# Patient Record
Sex: Female | Born: 1965 | Race: White | Hispanic: No | Smoking: Never smoker
Health system: Southern US, Community
[De-identification: ages and names within clinical notes are randomized; demographics above are authoritative.]

## PROBLEM LIST (undated history)

## (undated) DIAGNOSIS — R0989 Other specified symptoms and signs involving the circulatory and respiratory systems: Secondary | ICD-10-CM

## (undated) DIAGNOSIS — K660 Peritoneal adhesions (postprocedural) (postinfection): Secondary | ICD-10-CM

## (undated) DIAGNOSIS — R896 Abnormal cytological findings in specimens from other organs, systems and tissues: Secondary | ICD-10-CM

## (undated) DIAGNOSIS — G709 Myoneural disorder, unspecified: Secondary | ICD-10-CM

## (undated) DIAGNOSIS — IMO0002 Reserved for concepts with insufficient information to code with codable children: Secondary | ICD-10-CM

## (undated) DIAGNOSIS — T7840XA Allergy, unspecified, initial encounter: Secondary | ICD-10-CM

## (undated) DIAGNOSIS — F419 Anxiety disorder, unspecified: Secondary | ICD-10-CM

## (undated) DIAGNOSIS — E785 Hyperlipidemia, unspecified: Secondary | ICD-10-CM

## (undated) HISTORY — PX: BREAST BIOPSY: SHX20

## (undated) HISTORY — DX: Abnormal cytological findings in specimens from other organs, systems and tissues: R89.6

## (undated) HISTORY — DX: Reserved for concepts with insufficient information to code with codable children: IMO0002

## (undated) HISTORY — DX: Myoneural disorder, unspecified: G70.9

## (undated) HISTORY — DX: Other specified symptoms and signs involving the circulatory and respiratory systems: R09.89

## (undated) HISTORY — DX: Allergy, unspecified, initial encounter: T78.40XA

## (undated) HISTORY — DX: Peritoneal adhesions (postprocedural) (postinfection): K66.0

## (undated) HISTORY — DX: Anxiety disorder, unspecified: F41.9

## (undated) HISTORY — DX: Hyperlipidemia, unspecified: E78.5

## (undated) HISTORY — PX: LAPAROSCOPIC ENDOMETRIOSIS FULGURATION: SUR769

---
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
## 1993-08-11 HISTORY — PX: NASAL SINUS SURGERY: SHX719

## 1999-08-01 ENCOUNTER — Other Ambulatory Visit: Admission: RE | Admit: 1999-08-01 | Discharge: 1999-08-01 | Payer: Self-pay | Admitting: Gynecology

## 2000-06-22 ENCOUNTER — Other Ambulatory Visit: Admission: RE | Admit: 2000-06-22 | Discharge: 2000-06-22 | Payer: Self-pay | Admitting: Gynecology

## 2000-12-08 ENCOUNTER — Other Ambulatory Visit: Admission: RE | Admit: 2000-12-08 | Discharge: 2000-12-08 | Payer: Self-pay | Admitting: Gynecology

## 2001-07-05 ENCOUNTER — Other Ambulatory Visit: Admission: RE | Admit: 2001-07-05 | Discharge: 2001-07-05 | Payer: Self-pay | Admitting: Gynecology

## 2002-10-11 ENCOUNTER — Other Ambulatory Visit: Admission: RE | Admit: 2002-10-11 | Discharge: 2002-10-11 | Payer: Self-pay | Admitting: Gynecology

## 2003-11-06 ENCOUNTER — Other Ambulatory Visit: Admission: RE | Admit: 2003-11-06 | Discharge: 2003-11-06 | Payer: Self-pay | Admitting: Gynecology

## 2004-11-21 ENCOUNTER — Other Ambulatory Visit: Admission: RE | Admit: 2004-11-21 | Discharge: 2004-11-21 | Payer: Self-pay | Admitting: Gynecology

## 2005-02-12 ENCOUNTER — Other Ambulatory Visit: Admission: RE | Admit: 2005-02-12 | Discharge: 2005-02-12 | Payer: Self-pay | Admitting: Gynecology

## 2005-03-11 DIAGNOSIS — IMO0002 Reserved for concepts with insufficient information to code with codable children: Secondary | ICD-10-CM

## 2005-03-11 HISTORY — DX: Reserved for concepts with insufficient information to code with codable children: IMO0002

## 2005-11-09 DIAGNOSIS — IMO0001 Reserved for inherently not codable concepts without codable children: Secondary | ICD-10-CM

## 2005-11-09 HISTORY — DX: Reserved for inherently not codable concepts without codable children: IMO0001

## 2005-11-24 ENCOUNTER — Other Ambulatory Visit: Admission: RE | Admit: 2005-11-24 | Discharge: 2005-11-24 | Payer: Self-pay | Admitting: Gynecology

## 2006-01-23 ENCOUNTER — Encounter: Admission: RE | Admit: 2006-01-23 | Discharge: 2006-01-23 | Payer: Self-pay | Admitting: Gynecology

## 2006-06-18 ENCOUNTER — Other Ambulatory Visit: Admission: RE | Admit: 2006-06-18 | Discharge: 2006-06-18 | Payer: Self-pay | Admitting: Gynecology

## 2006-11-26 ENCOUNTER — Other Ambulatory Visit: Admission: RE | Admit: 2006-11-26 | Discharge: 2006-11-26 | Payer: Self-pay | Admitting: Gynecology

## 2007-01-26 ENCOUNTER — Encounter: Admission: RE | Admit: 2007-01-26 | Discharge: 2007-01-26 | Payer: Self-pay | Admitting: Gynecology

## 2007-12-13 ENCOUNTER — Other Ambulatory Visit: Admission: RE | Admit: 2007-12-13 | Discharge: 2007-12-13 | Payer: Self-pay | Admitting: Gynecology

## 2008-01-27 ENCOUNTER — Encounter: Admission: RE | Admit: 2008-01-27 | Discharge: 2008-01-27 | Payer: Self-pay | Admitting: Gynecology

## 2008-12-27 ENCOUNTER — Encounter: Payer: Self-pay | Admitting: Gynecology

## 2008-12-27 ENCOUNTER — Other Ambulatory Visit: Admission: RE | Admit: 2008-12-27 | Discharge: 2008-12-27 | Payer: Self-pay | Admitting: Gynecology

## 2008-12-27 ENCOUNTER — Ambulatory Visit: Payer: Self-pay | Admitting: Gynecology

## 2009-01-09 ENCOUNTER — Ambulatory Visit: Payer: Self-pay | Admitting: Gynecology

## 2009-01-29 ENCOUNTER — Encounter: Admission: RE | Admit: 2009-01-29 | Discharge: 2009-01-29 | Payer: Self-pay | Admitting: Gynecology

## 2009-06-21 ENCOUNTER — Ambulatory Visit: Payer: Self-pay | Admitting: Gynecology

## 2010-04-22 ENCOUNTER — Other Ambulatory Visit: Admission: RE | Admit: 2010-04-22 | Discharge: 2010-04-22 | Payer: Self-pay | Admitting: Gynecology

## 2010-04-22 ENCOUNTER — Encounter: Admission: RE | Admit: 2010-04-22 | Discharge: 2010-04-22 | Payer: Self-pay | Admitting: Gynecology

## 2010-04-22 ENCOUNTER — Ambulatory Visit: Payer: Self-pay | Admitting: Gynecology

## 2010-08-11 HISTORY — PX: LAPAROSCOPIC TOTAL HYSTERECTOMY: SUR800

## 2010-08-20 ENCOUNTER — Ambulatory Visit
Admission: RE | Admit: 2010-08-20 | Discharge: 2010-08-20 | Payer: Self-pay | Source: Home / Self Care | Attending: Gynecology | Admitting: Gynecology

## 2010-08-26 LAB — CBC
HCT: 36.4 % (ref 36.0–46.0)
Hemoglobin: 12.7 g/dL (ref 12.0–15.0)
MCH: 30.3 pg (ref 26.0–34.0)
MCHC: 34.9 g/dL (ref 30.0–36.0)
MCV: 86.9 fL (ref 78.0–100.0)
Platelets: 183 10*3/uL (ref 150–400)
RBC: 4.19 MIL/uL (ref 3.87–5.11)
RDW: 12.2 % (ref 11.5–15.5)
WBC: 6.5 10*3/uL (ref 4.0–10.5)

## 2010-08-26 LAB — SURGICAL PCR SCREEN
MRSA, PCR: NEGATIVE
Staphylococcus aureus: NEGATIVE

## 2010-08-27 ENCOUNTER — Ambulatory Visit (HOSPITAL_COMMUNITY)
Admission: RE | Admit: 2010-08-27 | Discharge: 2010-08-28 | Payer: Self-pay | Source: Home / Self Care | Attending: Gynecology | Admitting: Gynecology

## 2010-08-27 ENCOUNTER — Encounter: Payer: Self-pay | Admitting: Gynecology

## 2010-08-28 LAB — HCG, SERUM, QUALITATIVE: Preg, Serum: NEGATIVE

## 2010-08-29 NOTE — Discharge Summary (Signed)
  NAMEANESIA, Julie Cantrell                ACCOUNT NO.:  [id number]  MEDICAL RECORD NO.:  [id number]          PATIENT TYPE:  OIB  LOCATION:  9302                          FACILITY:  [facility]  PHYSICIAN:  Clothilde Tippetts P. Khameron Gruenwald, M.D.DATE OF BIRTH:  1966-04-19  DATE OF ADMISSION:  08/27/2010 DATE OF DISCHARGE:  08/28/2010                              DISCHARGE SUMMARY   DISCHARGE DIAGNOSES: 1. Dysmenorrhea. 2. Menorrhagia. 3. Abdominal adhesions.  PROCEDURES:  Total laparoscopic hysterectomy, lysis of adhesions, August 27, 2010.  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  The patient's postoperative course was uncomplicated.  She was discharged on postoperative day #1, ambulating well, tolerating a regular diet, voiding without difficulty with a postoperative hemoglobin of 10.0.  The patient received precautions, instructions, and followup, will be seen in the office in 2 weeks, received a prescription for Tylox #25 one to two p.o. q.6 hours p.r.n. pain.     Byard Carranza P. Audie Box, M.D.     TPF/MEDQ  D:  08/28/2010  T:  08/28/2010  Job:  [id number]  Electronically Signed by Colin Broach M.D. on 08/29/2010 03:50:47 PM

## 2010-09-02 LAB — CBC
HCT: 28.5 % — ABNORMAL LOW (ref 36.0–46.0)
Hemoglobin: 10 g/dL — ABNORMAL LOW (ref 12.0–15.0)
MCH: 30.6 pg (ref 26.0–34.0)
MCHC: 35.1 g/dL (ref 30.0–36.0)
MCV: 87.2 fL (ref 78.0–100.0)
Platelets: 140 10*3/uL — ABNORMAL LOW (ref 150–400)
RBC: 3.27 MIL/uL — ABNORMAL LOW (ref 3.87–5.11)
RDW: 12.2 % (ref 11.5–15.5)
WBC: 7.2 10*3/uL (ref 4.0–10.5)

## 2010-09-10 ENCOUNTER — Ambulatory Visit
Admission: RE | Admit: 2010-09-10 | Discharge: 2010-09-10 | Payer: Self-pay | Source: Home / Self Care | Attending: Gynecology | Admitting: Gynecology

## 2010-09-24 ENCOUNTER — Ambulatory Visit: Payer: BC Managed Care – PPO | Admitting: Gynecology

## 2010-09-24 DIAGNOSIS — Z9282 Status post administration of tPA (rtPA) in a different facility within the last 24 hours prior to admission to current facility: Secondary | ICD-10-CM

## 2010-10-10 DIAGNOSIS — R0989 Other specified symptoms and signs involving the circulatory and respiratory systems: Secondary | ICD-10-CM

## 2010-10-10 HISTORY — DX: Other specified symptoms and signs involving the circulatory and respiratory systems: R09.89

## 2010-11-30 ENCOUNTER — Encounter: Payer: Self-pay | Admitting: Internal Medicine

## 2010-12-03 ENCOUNTER — Ambulatory Visit (INDEPENDENT_AMBULATORY_CARE_PROVIDER_SITE_OTHER): Payer: BC Managed Care – PPO | Admitting: Internal Medicine

## 2010-12-03 ENCOUNTER — Encounter: Payer: Self-pay | Admitting: Internal Medicine

## 2010-12-03 VITALS — BP 109/75 | HR 58 | Ht 67.0 in | Wt 146.8 lb

## 2010-12-03 DIAGNOSIS — I4949 Other premature depolarization: Secondary | ICD-10-CM

## 2010-12-03 DIAGNOSIS — R002 Palpitations: Secondary | ICD-10-CM

## 2010-12-03 DIAGNOSIS — I493 Ventricular premature depolarization: Secondary | ICD-10-CM | POA: Insufficient documentation

## 2010-12-03 LAB — BASIC METABOLIC PANEL
BUN: 14 mg/dL (ref 6–23)
CO2: 25 mEq/L (ref 19–32)
Calcium: 8.9 mg/dL (ref 8.4–10.5)
Chloride: 104 mEq/L (ref 96–112)
Creatinine, Ser: 0.7 mg/dL (ref 0.4–1.2)
GFR: 100.94 mL/min (ref >60.00)
Glucose, Bld: 76 mg/dL (ref 70–99)
Potassium: 3.8 mEq/L (ref 3.5–5.1)
Sodium: 137 mEq/L (ref 135–145)

## 2010-12-03 LAB — MAGNESIUM: Magnesium: 2.2 mg/dL (ref 1.5–2.5)

## 2010-12-03 LAB — TSH: TSH: 1.61 u[IU]/mL (ref 0.35–5.50)

## 2010-12-03 NOTE — Progress Notes (Signed)
HPI: Julie Cantrell is a 45 y.o. female Seen at the request of [facility] because of PVCs.  She underwent a partial hysterectomy in January. In the wake of that she noted palpitations. They were relatively infrequent. They were not particularly aggravated by her 3 cups of coffee or a one bottle of Pepsi a day. They were not aggravated by her level of activity. She simply underwent a Holter monitor dated 3/13 which demonstrated 1.5% PVCs and had a morphology in the inferolateral leads( inferred from the QRS morphology) of a biphasic complex.  She has had no syncope. He has had no sustained tachycardia palpitations. She has noted no change in her  Current Outpatient Prescriptions  Medication Sig Dispense Refill  . Multiple Vitamin (MULTIVITAMIN) capsule Take 1 capsule by mouth daily.          No Known Allergies  Past Medical History  Diagnosis Date  . Anxiety   . Menorrhagia   . Dysmenorrhea   . Abdominal adhesions   . Pulse irregularity 10-2010    Past Surgical History  Procedure Date  . Laparoscopic total hysterectomy 08/27/2010  . Nasal sinus surgery 1995  . Kidney surgery 1985    No family history on file.  History   Social History  . Marital Status: Married    Spouse Name: N/A    Number of Children: N/A  . Years of Education: N/A   Occupational History  . Not on file.   Social History Main Topics  . Smoking status: Never Smoker   . Smokeless tobacco: Not on file  . Alcohol Use: Yes     social  . Drug Use: No  . Sexually Active: Not on file   Other Topics Concern  . Not on file   Social History Narrative  . No narrative on file    Fourteen point review of systems was negative except as noted in HPI and PMH   PHYSICAL EXAMINATION  Blood pressure 109/75, pulse 58, height 5\' 7"  (1.702 m), weight 146 lb 12.8 oz (66.588 kg).   Well developed and nourished younger Caucasian woman in no acute distress HENT normal Neck supple with  JVP-flat Carotids brisk and full without bruits Back without scoliosis or kyphosis Clear Regular rate and rhythm, no murmurs or gallops Abd-soft with active BS without hepatomegaly or midline pulsation Femoral pulses 2+ distal pulses intact No Clubbing cyanosis edema Skin-warm and dry LN-neg submandibular and supraclavicular A & Oriented CN 3-12 normal  Grossly normal sensory and motor function Affect engaging .  Sinus rhythm at 61 Intervals 0.19/0.08/0.42 Axis is 72 R. SR prime V1 and V2 Otherwise normal; inverted T waves V2

## 2010-12-03 NOTE — Assessment & Plan Note (Signed)
The patient has PVCs that began in January. They occur probably through the day and this is confirmed by her Holter monitor. The percentage is about 1.5%.  The morphology does not suggest atypical source.  They are minimally symptomatic and assurance has gone a long way. I think the issues are to exclude an underlying cause. To that end we will check her electrolytes and her thyroid status as well as an echo cardiogram to look for evidence of left ventricular or right ventricular structural abnormalities.

## 2010-12-03 NOTE — Patient Instructions (Addendum)
Your physician recommends that you schedule a follow-up appointment in: pending echo results   Your physician has requested that you have an echocardiogram. Echocardiography is a painless test that uses sound waves to create images of your heart. It provides your doctor with information about the size and shape of your heart and how well your heart's chambers and valves are working. This procedure takes approximately one hour. There are no restrictions for this procedure.   Your physician recommends that you return for lab work in: today bmet mag and tsh  Dx 785.1

## 2010-12-10 ENCOUNTER — Ambulatory Visit (HOSPITAL_COMMUNITY): Payer: BC Managed Care – PPO | Attending: Internal Medicine

## 2010-12-10 DIAGNOSIS — R002 Palpitations: Secondary | ICD-10-CM | POA: Insufficient documentation

## 2010-12-10 DIAGNOSIS — I4949 Other premature depolarization: Secondary | ICD-10-CM

## 2010-12-25 ENCOUNTER — Telehealth: Payer: Self-pay | Admitting: Internal Medicine

## 2010-12-25 NOTE — Telephone Encounter (Signed)
The pt is aware of her echo results. She needs f/u in 6 months with Dr. Graciela Husbands.

## 2011-01-09 ENCOUNTER — Encounter (HOSPITAL_COMMUNITY): Payer: Self-pay | Admitting: Internal Medicine

## 2011-05-05 ENCOUNTER — Other Ambulatory Visit: Payer: Self-pay | Admitting: Gynecology

## 2011-05-05 DIAGNOSIS — Z1231 Encounter for screening mammogram for malignant neoplasm of breast: Secondary | ICD-10-CM

## 2011-05-21 DIAGNOSIS — IMO0001 Reserved for inherently not codable concepts without codable children: Secondary | ICD-10-CM | POA: Insufficient documentation

## 2011-05-28 ENCOUNTER — Encounter: Payer: Self-pay | Admitting: Gynecology

## 2011-05-28 ENCOUNTER — Other Ambulatory Visit: Payer: Self-pay | Admitting: Plastic Surgery

## 2011-05-28 ENCOUNTER — Ambulatory Visit (INDEPENDENT_AMBULATORY_CARE_PROVIDER_SITE_OTHER): Payer: BC Managed Care – PPO | Admitting: Gynecology

## 2011-05-28 ENCOUNTER — Other Ambulatory Visit (HOSPITAL_COMMUNITY)
Admission: RE | Admit: 2011-05-28 | Discharge: 2011-05-28 | Disposition: A | Discharge status: Home / Self Care | Payer: BC Managed Care – PPO | Source: Ambulatory Visit | Attending: Gynecology | Admitting: Gynecology

## 2011-05-28 ENCOUNTER — Ambulatory Visit (HOSPITAL_COMMUNITY)
Admission: RE | Admit: 2011-05-28 | Discharge: 2011-05-28 | Disposition: A | Discharge status: Home / Self Care | Payer: BC Managed Care – PPO | Source: Ambulatory Visit | Attending: Plastic Surgery | Admitting: Plastic Surgery

## 2011-05-28 ENCOUNTER — Ambulatory Visit
Admission: RE | Admit: 2011-05-28 | Discharge: 2011-05-28 | Disposition: A | Discharge status: Home / Self Care | Payer: BC Managed Care – PPO | Source: Ambulatory Visit | Attending: Gynecology | Admitting: Gynecology

## 2011-05-28 VITALS — BP 102/68 | Ht 67.5 in | Wt 151.0 lb

## 2011-05-28 DIAGNOSIS — Z13220 Encounter for screening for lipoid disorders: Secondary | ICD-10-CM

## 2011-05-28 DIAGNOSIS — Z131 Encounter for screening for diabetes mellitus: Secondary | ICD-10-CM

## 2011-05-28 DIAGNOSIS — Z01419 Encounter for gynecological examination (general) (routine) without abnormal findings: Secondary | ICD-10-CM

## 2011-05-28 DIAGNOSIS — M899 Disorder of bone, unspecified: Secondary | ICD-10-CM

## 2011-05-28 DIAGNOSIS — Z1231 Encounter for screening mammogram for malignant neoplasm of breast: Secondary | ICD-10-CM

## 2011-05-28 DIAGNOSIS — D164 Benign neoplasm of bones of skull and face: Secondary | ICD-10-CM | POA: Insufficient documentation

## 2011-05-28 DIAGNOSIS — Z23 Encounter for immunization: Secondary | ICD-10-CM

## 2011-05-28 NOTE — Progress Notes (Signed)
Julie Cantrell [birth year] [id number]        45 y.o.  for annual exam.  Doing well status post TLH earlier this year.  Past medical history,surgical history, medications, allergies, family history and social history were all reviewed and documented in the EPIC chart. ROS:  Was performed and pertinent positives and negatives are included in the history.  Exam: chaperone present Filed Vitals:   05/28/11 1549  BP: 102/68   General appearance  Normal Skin grossly normal Head/Neck normal with no cervical or supraclavicular adenopathy thyroid normal Lungs  clear Cardiac RR, without RMG Abdominal  soft, nontender, without masses, organomegaly or hernia Breasts  examined lying and sitting without masses, retractions, discharge or axillary adenopathy. Pelvic  Ext/BUS/vagina  normal with Pap of cuff done  Adnexa  Without masses or tenderness    Anus and perineum  normal   Rectovaginal  normal sphincter tone without palpated masses or tenderness.    Assessment/Plan:  45 y.o. female for annual exam.   Doing well status post TL H. Self breast exams on a monthly basis discussed encouraged. Has mammogram scheduled today we'll follow up for this. Does have history of low-grade SIL changes previously several years ago so I went ahead and did Pap smear of the cuff today. I reviewed current screening recommendations with her as far as stop doing Pap smears after hysterectomy and less frequent screening intervals of 3-5 years. I think given her relatively recent dysplasia although low grade we'll do annual cuff cytologies times several and then if negative will go to a less frequent screening interval she agrees with this. We'll check baseline labs to include CBC glucose lipid profile urinalysis. Assuming she continues well from a gynecologic standpoint she'll see [place] in a year sooner as needed    Dara Lords MD, 4:25 PM 05/28/2011

## 2011-07-09 ENCOUNTER — Ambulatory Visit: Payer: BC Managed Care – PPO | Admitting: Internal Medicine

## 2011-07-30 ENCOUNTER — Ambulatory Visit (INDEPENDENT_AMBULATORY_CARE_PROVIDER_SITE_OTHER): Payer: BC Managed Care – PPO | Admitting: Internal Medicine

## 2011-07-30 ENCOUNTER — Encounter: Payer: Self-pay | Admitting: Internal Medicine

## 2011-07-30 VITALS — BP 113/79 | HR 64 | Ht 68.0 in | Wt 145.8 lb

## 2011-07-30 DIAGNOSIS — I493 Ventricular premature depolarization: Secondary | ICD-10-CM

## 2011-07-30 DIAGNOSIS — I4949 Other premature depolarization: Secondary | ICD-10-CM

## 2011-07-30 NOTE — Progress Notes (Signed)
  HPI  Julie Cantrell is a 45 y.o. female Seen in followup for PVCs;  Her echo in May 2012 is normal  PVCs are less problematic as she focuses on them less frequently    Past Medical History  Diagnosis Date  . Anxiety   . Menorrhagia   . Dysmenorrhea   . Abdominal adhesions   . Pulse irregularity 10-2010  . 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 ASCUS (atypical squamous cells of undetermined significance) on Pap smear 11/2005    NORMAL PAPS 11/07 AND 4/08    Past Surgical History  Procedure Date  . Laparoscopic total hysterectomy 08/27/2010  . Nasal sinus surgery 1995  . Kidney surgery 1985    Current Outpatient Prescriptions  Medication Sig Dispense Refill  . Multiple Vitamin (MULTIVITAMIN) capsule Take 1 capsule by mouth daily.          No Known Allergies  Review of Systems negative except from HPI and PMH  Physical Exam Well developed and well nourished in no acute distress HENT normal E scleral and icterus clear Neck Supple JVP flat; carotids brisk and full Clear to ausculation Regular rate and rhythm, no murmurs gallops or rub Soft with active bowel sounds No clubbing cyanosis none Edema Alert and oriented, grossly normal motor and sensory function Skin Warm and Dry   Other cardiogram demonstrates sinus rhythm at 60 Intervals 0.18/0.07/0.43 R Prime in lead V2 Otherwise normal Assessment and  Plan

## 2011-07-30 NOTE — Assessment & Plan Note (Signed)
He seemed to be stable. We discussed the role of biofeedback in the event that they become more noxious. We will see her again p.r.n.

## 2012-05-20 ENCOUNTER — Other Ambulatory Visit: Payer: Self-pay | Admitting: Gynecology

## 2012-05-20 DIAGNOSIS — Z1231 Encounter for screening mammogram for malignant neoplasm of breast: Secondary | ICD-10-CM

## 2012-06-09 ENCOUNTER — Encounter: Payer: BC Managed Care – PPO | Admitting: Gynecology

## 2012-06-15 ENCOUNTER — Other Ambulatory Visit: Payer: Self-pay | Admitting: Gynecology

## 2012-06-15 ENCOUNTER — Encounter: Payer: Self-pay | Admitting: Gynecology

## 2012-06-15 ENCOUNTER — Ambulatory Visit (INDEPENDENT_AMBULATORY_CARE_PROVIDER_SITE_OTHER): Payer: BC Managed Care – PPO | Admitting: Gynecology

## 2012-06-15 ENCOUNTER — Ambulatory Visit
Admission: RE | Admit: 2012-06-15 | Discharge: 2012-06-15 | Disposition: A | Discharge status: Home / Self Care | Payer: BC Managed Care – PPO | Source: Ambulatory Visit | Attending: Gynecology | Admitting: Gynecology

## 2012-06-15 VITALS — BP 110/70 | Ht 68.0 in | Wt 144.0 lb

## 2012-06-15 DIAGNOSIS — N898 Other specified noninflammatory disorders of vagina: Secondary | ICD-10-CM

## 2012-06-15 DIAGNOSIS — F419 Anxiety disorder, unspecified: Secondary | ICD-10-CM | POA: Insufficient documentation

## 2012-06-15 DIAGNOSIS — Z01419 Encounter for gynecological examination (general) (routine) without abnormal findings: Secondary | ICD-10-CM

## 2012-06-15 DIAGNOSIS — Z1231 Encounter for screening mammogram for malignant neoplasm of breast: Secondary | ICD-10-CM

## 2012-06-15 NOTE — Progress Notes (Signed)
Julie Cantrell 05-11-1966 [id number]        46 y.o.  G2P1001 for annual exam.    Past medical history,surgical history, medications, allergies, family history and social history were all reviewed and documented in the EPIC chart. ROS:  Was performed and pertinent positives and negatives are included in the history.  Exam: Kim assistant Filed Vitals:   06/15/12 1405  BP: 110/70  Height: 5\' 8"  (1.727 m)  Weight: 144 lb (65.318 kg)   General appearance  Normal Skin grossly normal Head/Neck normal with no cervical or supraclavicular adenopathy thyroid normal Lungs  clear Cardiac RR, without RMG Abdominal  soft, nontender, without masses, organomegaly or hernia Breasts  examined lying and sitting without masses, retractions, discharge or axillary adenopathy. Pelvic  Ext/BUS/vagina  Nontender Simple appearing 2 cm cyst fingerbreadths in from the introital opening left of the urethra.  Adnexa  Without masses or tenderness    Anus and perineum  normal   Rectovaginal  normal sphincter tone without palpated masses or tenderness.    Assessment/Plan:  46 y.o. G59P1001 female for annual exam.   1. History TLH for menorrhagia. Doing well without complaints. 2. Vaginal cyst. 2 cm left of the course of the urethra does not feel attached. Options for observation versus drainage reviewed the patient was go ahead and have this drained. It is not painful. Overlying mucosa cleansed with Betadine infiltrated with 1% lidocaine and several punch biopsy specimens transvaginal mucosa through cyst wall taken and sent to pathology. Clear mucoid material drained. Inner cyst wall smooth. Patient will monitor and assuming it resolves we'll follow. If there is recurrent she'll represent for evaluation. She'll follow up for pathology results. 3. Pap smear.  No Pap smear done today. History of low-grade/ASCUS 2006 and 2007. Pap smears have been normal since then. Last Pap smear 2012. She is status post hysterectomy  and options of less frequent screening versus stopping altogether discussed. We'll plan less frequent screening of present every 3-5 years. 4. Mammography. Patient has scheduled today. We'll continue with annual mammography. SBE monthly reviewed. 5. Health maintenance. CBC lipid profile glucose normal last year and was not repeated. Check urinalysis. Follow up one year, sooner as needed.    Dara Lords MD, 2:44 PM 06/15/2012

## 2012-06-15 NOTE — Patient Instructions (Signed)
Office will call with biopsy results 

## 2012-06-16 LAB — URINALYSIS W MICROSCOPIC + REFLEX CULTURE
Bacteria, UA: NONE SEEN
Bilirubin Urine: NEGATIVE
Casts: NONE SEEN
Crystals: NONE SEEN
Glucose, UA: NEGATIVE mg/dL
Hgb urine dipstick: NEGATIVE
Ketones, ur: NEGATIVE mg/dL
Leukocytes, UA: NEGATIVE
Nitrite: NEGATIVE
Protein, ur: NEGATIVE mg/dL
Specific Gravity, Urine: 1.02 (ref 1.005–1.030)
Squamous Epithelial / HPF: NONE SEEN
Urobilinogen, UA: 0.2 mg/dL (ref 0.0–1.0)
pH: 6 (ref 5.0–8.0)

## 2013-05-16 ENCOUNTER — Other Ambulatory Visit: Payer: Self-pay

## 2013-05-16 DIAGNOSIS — Z1231 Encounter for screening mammogram for malignant neoplasm of breast: Secondary | ICD-10-CM

## 2013-06-01 ENCOUNTER — Ambulatory Visit: Payer: Self-pay

## 2013-06-01 ENCOUNTER — Encounter (INDEPENDENT_AMBULATORY_CARE_PROVIDER_SITE_OTHER): Payer: Self-pay

## 2013-06-01 ENCOUNTER — Ambulatory Visit (INDEPENDENT_AMBULATORY_CARE_PROVIDER_SITE_OTHER): Payer: BC Managed Care – PPO

## 2013-06-01 DIAGNOSIS — Z23 Encounter for immunization: Secondary | ICD-10-CM

## 2013-06-16 ENCOUNTER — Ambulatory Visit (INDEPENDENT_AMBULATORY_CARE_PROVIDER_SITE_OTHER): Payer: BC Managed Care – PPO | Admitting: Gynecology

## 2013-06-16 ENCOUNTER — Ambulatory Visit
Admission: RE | Admit: 2013-06-16 | Discharge: 2013-06-16 | Disposition: A | Discharge status: Home / Self Care | Payer: BC Managed Care – PPO | Source: Ambulatory Visit

## 2013-06-16 ENCOUNTER — Encounter: Payer: Self-pay | Admitting: Gynecology

## 2013-06-16 VITALS — BP 122/76 | Ht 68.0 in | Wt 137.0 lb

## 2013-06-16 DIAGNOSIS — Z01419 Encounter for gynecological examination (general) (routine) without abnormal findings: Secondary | ICD-10-CM

## 2013-06-16 DIAGNOSIS — F411 Generalized anxiety disorder: Secondary | ICD-10-CM

## 2013-06-16 DIAGNOSIS — Z1231 Encounter for screening mammogram for malignant neoplasm of breast: Secondary | ICD-10-CM

## 2013-06-16 LAB — CBC WITH DIFFERENTIAL/PLATELET
Eosinophils Absolute: 0.1 10*3/uL (ref 0.0–0.7)
Eosinophils Relative: 2 % (ref 0–5)
HCT: 34.7 % — ABNORMAL LOW (ref 36.0–46.0)
Lymphocytes Relative: 30 % (ref 12–46)
Lymphs Abs: 1.7 10*3/uL (ref 0.7–4.0)
MCH: 30.7 pg (ref 26.0–34.0)
MCV: 88.1 fL (ref 78.0–100.0)
Monocytes Absolute: 0.5 10*3/uL (ref 0.1–1.0)
Platelets: 198 10*3/uL (ref 150–400)
RBC: 3.94 MIL/uL (ref 3.87–5.11)
RDW: 12.7 % (ref 11.5–15.5)
WBC: 5.5 10*3/uL (ref 4.0–10.5)

## 2013-06-16 LAB — LIPID PANEL
Cholesterol: 150 mg/dL (ref 0–200)
HDL: 48 mg/dL (ref >39)
Total CHOL/HDL Ratio: 3.1 Ratio
Triglycerides: 56 mg/dL (ref <150)
VLDL: 11 mg/dL (ref 0–40)

## 2013-06-16 LAB — COMPREHENSIVE METABOLIC PANEL
ALT: 8 U/L (ref 0–35)
BUN: 11 mg/dL (ref 6–23)
CO2: 25 mEq/L (ref 19–32)
Calcium: 8.7 mg/dL (ref 8.4–10.5)
Chloride: 106 mEq/L (ref 96–112)
Creat: 0.67 mg/dL (ref 0.50–1.10)
Sodium: 138 mEq/L (ref 135–145)
Total Bilirubin: 0.7 mg/dL (ref 0.3–1.2)

## 2013-06-16 MED ORDER — ALPRAZOLAM 0.25 MG PO TABS: 0.2500 mg | ORAL_TABLET | Freq: Every evening | ORAL | Status: DC | PRN

## 2013-06-16 NOTE — Progress Notes (Signed)
Julie Cantrell May 02, 1966 [id number]        47 y.o.  G2P1001 for annual exam.  Doing well without complaints. Several issues noted below.  Past medical history,surgical history, problem list, medications, allergies, family history and social history were all reviewed and documented in the EPIC chart.  ROS:  Performed and pertinent positives and negatives are included in the history, assessment and plan .  Exam: Kim assistant Filed Vitals:   06/16/13 1357  BP: 122/76  Height: 5\' 8"  (1.727 m)  Weight: 137 lb (62.143 kg)   General appearance  Normal Skin grossly normal Head/Neck normal with no cervical or supraclavicular adenopathy thyroid normal Lungs  clear Cardiac RR, without RMG Abdominal  soft, nontender, without masses, organomegaly or hernia Breasts  examined lying and sitting without masses, retractions, discharge or axillary adenopathy. Pelvic  Ext/BUS/vagina  normal  Adnexa  Without masses or tenderness    Anus and perineum  normal   Rectovaginal  normal sphincter tone without palpated masses or tenderness.    Assessment/Plan:  47 y.o. G77P1001 female for annual exam.   1. Status post TLH 2012 for menorrhagia/adenomyosis. She is doing well without symptoms such as hot flushes night sweats or other menopausal symptoms. Will continue to monitor. 2. Anxiety. Patient's having some anxiety related to work and asked about something to help take the edge off. We reviewed various options and ultimately decided on Xanax 0.25 #30 with 1 refill when necessary. Side effect profile reviewed. 3. Pap smear 2012. No Pap smear done today. 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 Normal Pap smears after then. Options to stop screening altogether or less frequent screening intervals reviewed as she is status post hysterectomy for benign indications. Will readdress on annual basis. 4. Mammography today. Continue with annual mammography. SBE monthly reviewed. 5. Health maintenance. Baseline CBC  comprehensive metabolic panel lipid profile urinalysis ordered. Followup one year, sooner as needed.  Note: This document was prepared with digital dictation and possible smart phrase technology. Any transcriptional errors that result from this process are unintentional.   Dara Lords MD, 2:18 PM 06/16/2013

## 2013-06-16 NOTE — Patient Instructions (Signed)
Follow up in one year, sooner as needed. 

## 2013-06-17 LAB — URINALYSIS W MICROSCOPIC + REFLEX CULTURE
Bacteria, UA: NONE SEEN
Bilirubin Urine: NEGATIVE
Casts: NONE SEEN
Glucose, UA: NEGATIVE mg/dL
Hgb urine dipstick: NEGATIVE
Ketones, ur: NEGATIVE mg/dL
Protein, ur: NEGATIVE mg/dL
Squamous Epithelial / LPF: NONE SEEN
Urobilinogen, UA: 0.2 mg/dL (ref 0.0–1.0)
pH: 5 (ref 5.0–8.0)

## 2013-11-17 ENCOUNTER — Other Ambulatory Visit: Payer: Self-pay | Admitting: Gynecology

## 2013-11-17 NOTE — Telephone Encounter (Signed)
Called into pharmacy

## 2014-01-11 ENCOUNTER — Encounter: Payer: Self-pay | Admitting: Family Medicine

## 2014-04-26 ENCOUNTER — Other Ambulatory Visit: Payer: Self-pay

## 2014-04-26 DIAGNOSIS — Z1231 Encounter for screening mammogram for malignant neoplasm of breast: Secondary | ICD-10-CM

## 2014-06-12 ENCOUNTER — Encounter: Payer: Self-pay | Admitting: Gynecology

## 2014-06-20 ENCOUNTER — Other Ambulatory Visit (HOSPITAL_COMMUNITY)
Admission: RE | Admit: 2014-06-20 | Discharge: 2014-06-20 | Disposition: A | Discharge status: Home / Self Care | Payer: BC Managed Care – PPO | Source: Ambulatory Visit | Attending: Gynecology | Admitting: Gynecology

## 2014-06-20 ENCOUNTER — Encounter: Payer: Self-pay | Admitting: Gynecology

## 2014-06-20 ENCOUNTER — Ambulatory Visit (INDEPENDENT_AMBULATORY_CARE_PROVIDER_SITE_OTHER): Payer: BC Managed Care – PPO | Admitting: Gynecology

## 2014-06-20 ENCOUNTER — Ambulatory Visit
Admission: RE | Admit: 2014-06-20 | Discharge: 2014-06-20 | Disposition: A | Discharge status: Home / Self Care | Payer: BC Managed Care – PPO | Source: Ambulatory Visit

## 2014-06-20 VITALS — BP 120/70 | Ht 68.0 in | Wt 141.0 lb

## 2014-06-20 DIAGNOSIS — Z01419 Encounter for gynecological examination (general) (routine) without abnormal findings: Secondary | ICD-10-CM | POA: Insufficient documentation

## 2014-06-20 DIAGNOSIS — Z1231 Encounter for screening mammogram for malignant neoplasm of breast: Secondary | ICD-10-CM

## 2014-06-20 LAB — CBC WITH DIFFERENTIAL/PLATELET
Basophils Absolute: 0 10*3/uL (ref 0.0–0.1)
Basophils Relative: 0 % (ref 0–1)
Eosinophils Absolute: 0.1 10*3/uL (ref 0.0–0.7)
Eosinophils Relative: 2 % (ref 0–5)
HCT: 36.6 % (ref 36.0–46.0)
Hemoglobin: 12.9 g/dL (ref 12.0–15.0)
LYMPHS ABS: 1.7 10*3/uL (ref 0.7–4.0)
LYMPHS PCT: 31 % (ref 12–46)
MCH: 31.2 pg (ref 26.0–34.0)
MCHC: 35.2 g/dL (ref 30.0–36.0)
MCV: 88.4 fL (ref 78.0–100.0)
Monocytes Absolute: 0.4 10*3/uL (ref 0.1–1.0)
Monocytes Relative: 8 % (ref 3–12)
NEUTROS PCT: 59 % (ref 43–77)
Neutro Abs: 3.2 10*3/uL (ref 1.7–7.7)
PLATELETS: 214 10*3/uL (ref 150–400)
RBC: 4.14 MIL/uL (ref 3.87–5.11)
RDW: 13.2 % (ref 11.5–15.5)
WBC: 5.4 10*3/uL (ref 4.0–10.5)

## 2014-06-20 LAB — LIPID PANEL
CHOLESTEROL: 168 mg/dL (ref 0–200)
HDL: 59 mg/dL (ref >39)
LDL Cholesterol: 94 mg/dL (ref 0–99)
Total CHOL/HDL Ratio: 2.8 Ratio
Triglycerides: 76 mg/dL (ref <150)
VLDL: 15 mg/dL (ref 0–40)

## 2014-06-20 LAB — COMPREHENSIVE METABOLIC PANEL
ALT: 14 U/L (ref 0–35)
AST: 14 U/L (ref 0–37)
Albumin: 4.1 g/dL (ref 3.5–5.2)
Alkaline Phosphatase: 48 U/L (ref 39–117)
BUN: 12 mg/dL (ref 6–23)
CALCIUM: 9 mg/dL (ref 8.4–10.5)
CHLORIDE: 103 meq/L (ref 96–112)
CO2: 26 meq/L (ref 19–32)
Creat: 0.68 mg/dL (ref 0.50–1.10)
Glucose, Bld: 80 mg/dL (ref 70–99)
Potassium: 4 mEq/L (ref 3.5–5.3)
SODIUM: 137 meq/L (ref 135–145)
TOTAL PROTEIN: 6.3 g/dL (ref 6.0–8.3)
Total Bilirubin: 0.5 mg/dL (ref 0.2–1.2)

## 2014-06-20 LAB — TSH: TSH: 1.821 u[IU]/mL (ref 0.350–4.500)

## 2014-06-20 MED ORDER — ALPRAZOLAM 0.25 MG PO TABS: ORAL_TABLET | ORAL | Status: DC

## 2014-06-20 NOTE — Patient Instructions (Signed)
You may obtain a copy of any labs that were done today by logging onto MyChart as outlined in the instructions provided with your AVS (after visit summary). The office will not call with normal lab results but certainly if there are any significant abnormalities then we will contact you.   Health Maintenance, Female A healthy lifestyle and preventative care can promote health and wellness.  Maintain regular health, dental, and eye exams.  Eat a healthy diet. Foods like vegetables, fruits, whole grains, low-fat dairy products, and lean protein foods contain the nutrients you need without too many calories. Decrease your intake of foods high in solid fats, added sugars, and salt. Get information about a proper diet from your caregiver, if necessary.  Regular physical exercise is one of the most important things you can do for your health. Most adults should get at least 150 minutes of moderate-intensity exercise (any activity that increases your heart rate and causes you to sweat) each week. In addition, most adults need muscle-strengthening exercises on 2 or more days a week.   Maintain a healthy weight. The body mass index (BMI) is a screening tool to identify possible weight problems. It provides an estimate of body fat based on height and weight. Your caregiver can help determine your BMI, and can help you achieve or maintain a healthy weight. For adults 20 years and older:  A BMI below 18.5 is considered underweight.  A BMI of 18.5 to 24.9 is normal.  A BMI of 25 to 29.9 is considered overweight.  A BMI of 30 and above is considered obese.  Maintain normal blood lipids and cholesterol by exercising and minimizing your intake of saturated fat. Eat a balanced diet with plenty of fruits and vegetables. Blood tests for lipids and cholesterol should begin at age 61 and be repeated every 5 years. If your lipid or cholesterol levels are high, you are over 50, or you are a high risk for heart  disease, you may need your cholesterol levels checked more frequently.Ongoing high lipid and cholesterol levels should be treated with medicines if diet and exercise are not effective.  If you smoke, find out from your caregiver how to quit. If you do not use tobacco, do not start.  Lung cancer screening is recommended for adults aged 33 80 years who are at high risk for developing lung cancer because of a history of smoking. Yearly low-dose computed tomography (CT) is recommended for people who have at least a 30-pack-year history of smoking and are a current smoker or have quit within the past 15 years. A pack year of smoking is smoking an average of 1 pack of cigarettes a day for 1 year (for example: 1 pack a day for 30 years or 2 packs a day for 15 years). Yearly screening should continue until the smoker has stopped smoking for at least 15 years. Yearly screening should also be stopped for people who develop a health problem that would prevent them from having lung cancer treatment.  If you are pregnant, do not drink alcohol. If you are breastfeeding, be very cautious about drinking alcohol. If you are not pregnant and choose to drink alcohol, do not exceed 1 drink per day. One drink is considered to be 12 ounces (355 mL) of beer, 5 ounces (148 mL) of wine, or 1.5 ounces (44 mL) of liquor.  Avoid use of street drugs. Do not share needles with anyone. Ask for help if you need support or instructions about stopping  the use of drugs.  High blood pressure causes heart disease and increases the risk of stroke. Blood pressure should be checked at least every 1 to 2 years. Ongoing high blood pressure should be treated with medicines, if weight loss and exercise are not effective.  If you are 59 to 49 years old, ask your caregiver if you should take aspirin to prevent strokes.  Diabetes screening involves taking a blood sample to check your fasting blood sugar level. This should be done once every 3  years, after age 91, if you are within normal weight and without risk factors for diabetes. Testing should be considered at a younger age or be carried out more frequently if you are overweight and have at least 1 risk factor for diabetes.  Breast cancer screening is essential preventative care for women. You should practice "breast self-awareness." This means understanding the normal appearance and feel of your breasts and may include breast self-examination. Any changes detected, no matter how small, should be reported to a caregiver. Women in their 66s and 30s should have a clinical breast exam (CBE) by a caregiver as part of a regular health exam every 1 to 3 years. After age 101, women should have a CBE every year. Starting at age 100, women should consider having a mammogram (breast X-ray) every year. Women who have a family history of breast cancer should talk to their caregiver about genetic screening. Women at a high risk of breast cancer should talk to their caregiver about having an MRI and a mammogram every year.  Breast cancer gene (BRCA)-related cancer risk assessment is recommended for women who have family members with BRCA-related cancers. BRCA-related cancers include breast, ovarian, tubal, and peritoneal cancers. Having family members with these cancers may be associated with an increased risk for harmful changes (mutations) in the breast cancer genes BRCA1 and BRCA2. Results of the assessment will determine the need for genetic counseling and BRCA1 and BRCA2 testing.  The Pap test is a screening test for cervical cancer. Women should have a Pap test starting at age 57. Between ages 25 and 35, Pap tests should be repeated every 2 years. Beginning at age 37, you should have a Pap test every 3 years as long as the past 3 Pap tests have been normal. If you had a hysterectomy for a problem that was not cancer or a condition that could lead to cancer, then you no longer need Pap tests. If you are  between ages 50 and 76, and you have had normal Pap tests going back 10 years, you no longer need Pap tests. If you have had past treatment for cervical cancer or a condition that could lead to cancer, you need Pap tests and screening for cancer for at least 20 years after your treatment. If Pap tests have been discontinued, risk factors (such as a new sexual partner) need to be reassessed to determine if screening should be resumed. Some women have medical problems that increase the chance of getting cervical cancer. In these cases, your caregiver may recommend more frequent screening and Pap tests.  The human papillomavirus (HPV) test is an additional test that may be used for cervical cancer screening. The HPV test looks for the virus that can cause the cell changes on the cervix. The cells collected during the Pap test can be tested for HPV. The HPV test could be used to screen women aged 44 years and older, and should be used in women of any age  who have unclear Pap test results. After the age of 55, women should have HPV testing at the same frequency as a Pap test.  Colorectal cancer can be detected and often prevented. Most routine colorectal cancer screening begins at the age of 44 and continues through age 20. However, your caregiver may recommend screening at an earlier age if you have risk factors for colon cancer. On a yearly basis, your caregiver may provide home test kits to check for hidden blood in the stool. Use of a small camera at the end of a tube, to directly examine the colon (sigmoidoscopy or colonoscopy), can detect the earliest forms of colorectal cancer. Talk to your caregiver about this at age 86, when routine screening begins. Direct examination of the colon should be repeated every 5 to 10 years through age 13, unless early forms of pre-cancerous polyps or small growths are found.  Hepatitis C blood testing is recommended for all people born from [birth year] through 1966 and any  individual with known risks for hepatitis C.  Practice safe sex. Use condoms and avoid high-risk sexual practices to reduce the spread of sexually transmitted infections (STIs). Sexually active women aged 36 and younger should be checked for Chlamydia, which is a common sexually transmitted infection. Older women with new or multiple partners should also be tested for Chlamydia. Testing for other STIs is recommended if you are sexually active and at increased risk.  Osteoporosis is a disease in which the bones lose minerals and strength with aging. This can result in serious bone fractures. The risk of osteoporosis can be identified using a bone density scan. Women ages 20 and over and women at risk for fractures or osteoporosis should discuss screening with their caregivers. Ask your caregiver whether you should be taking a calcium supplement or vitamin D to reduce the rate of osteoporosis.  Menopause can be associated with physical symptoms and risks. Hormone replacement therapy is available to decrease symptoms and risks. You should talk to your caregiver about whether hormone replacement therapy is right for you.  Use sunscreen. Apply sunscreen liberally and repeatedly throughout the day. You should seek shade when your shadow is shorter than you. Protect yourself by wearing long sleeves, pants, a wide-brimmed hat, and sunglasses year round, whenever you are outdoors.  Notify your caregiver of new moles or changes in moles, especially if there is a change in shape or color. Also notify your caregiver if a mole is larger than the size of a pencil eraser.  Stay current with your immunizations. Document Released: 02/10/2011 Document Revised: 11/22/2012 Document Reviewed: 02/10/2011 [facility] Patient Information 2014 [employer].

## 2014-06-20 NOTE — Progress Notes (Signed)
Julie Cantrell Apr 20, 1966 [id number]        48 y.o.  G1P1001 for annual exam.  Doing well without complaints.  Past medical history,surgical history, problem list, medications, allergies, family history and social history were all reviewed and documented as reviewed in the EPIC chart.  ROS:  12 system ROS performed with pertinent positives and negatives included in the history, assessment and plan.   Additional significant findings :  none   Exam: Kim Counsellor Vitals:   06/20/14 1355  BP: 120/70  Height: 5\' 8"  (1.727 m)  Weight: 141 lb (63.957 kg)   General appearance:  Normal affect, orientation and appearance. Skin: Grossly normal HEENT: Without gross lesions.  No cervical or supraclavicular adenopathy. Thyroid normal.  Lungs:  Clear without wheezing, rales or rhonchi Cardiac: RR, without RMG Abdominal:  Soft, nontender, without masses, guarding, rebound, organomegaly or hernia Breasts:  Examined lying and sitting without masses, retractions, discharge or axillary adenopathy. Pelvic:  Ext/BUS/vagina normal. Pap of cuff done  Adnexa  Without masses or tenderness    Anus and perineum  Normal   Rectovaginal  Normal sphincter tone without palpated masses or tenderness.    Assessment/Plan:  48 y.o. G61P1001 female for annual exam.   1. Status post [place] 2012 for menorrhagia/adenomyosis. Doing well without menopausal symptoms. Continue to monitor. 2. Pap smear 2012. 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 Pap of cuff done today. Options to stop screening altogether or less frequent screening intervals reviewed per current screening guidelines. Will readdress on an annual basis. 3. Anxiety. Patient uses Xanax 0.25 mg occasionally for anxiety. #30 with 2 refills provided. 4. Mammography 06/2014. Continue with annual mammography. SBE monthly reviewed. 5. Health maintenance. Baseline CBC comprehensive metabolic panel lipid profile urinalysis TSH vitamin D ordered. Mother does have  history of osteoporosis. Follow up in one year, sooner as needed.     [name] MD, 2:29 PM 06/20/2014

## 2014-06-20 NOTE — Addendum Note (Signed)
Addended by: Nelva Nay on: 06/20/2014 02:42 PM   Modules accepted: Orders, SmartSet

## 2014-06-21 LAB — URINALYSIS W MICROSCOPIC + REFLEX CULTURE
Bacteria, UA: NONE SEEN
Bilirubin Urine: NEGATIVE
Casts: NONE SEEN
Crystals: NONE SEEN
GLUCOSE, UA: NEGATIVE mg/dL
Hgb urine dipstick: NEGATIVE
Ketones, ur: NEGATIVE mg/dL
LEUKOCYTES UA: NEGATIVE
Nitrite: NEGATIVE
PH: 5 (ref 5.0–8.0)
PROTEIN: NEGATIVE mg/dL
SQUAMOUS EPITHELIAL / LPF: NONE SEEN
Specific Gravity, Urine: 1.013 (ref 1.005–1.030)
Urobilinogen, UA: 0.2 mg/dL (ref 0.0–1.0)

## 2014-06-21 LAB — VITAMIN D 25 HYDROXY (VIT D DEFICIENCY, FRACTURES): Vit D, 25-Hydroxy: 42 ng/mL (ref 30–89)

## 2014-06-23 LAB — CYTOLOGY - PAP

## 2014-09-22 ENCOUNTER — Telehealth: Payer: Self-pay | Admitting: Nurse Practitioner

## 2014-09-22 MED ORDER — TOBRAMYCIN 0.3 % OP SOLN: 2.0000 [drp] | OPHTHALMIC | Status: DC

## 2014-09-22 NOTE — Telephone Encounter (Signed)
Eye drops sent to pharmacy

## 2014-09-22 NOTE — Telephone Encounter (Signed)
Patient aware.

## 2015-03-10 ENCOUNTER — Other Ambulatory Visit: Payer: Self-pay | Admitting: Nurse Practitioner

## 2015-03-12 MED ORDER — POLYMYXIN B-TRIMETHOPRIM 10000-0.1 UNIT/ML-% OP SOLN: 2.0000 [drp] | OPHTHALMIC | Status: DC

## 2015-03-12 NOTE — Telephone Encounter (Signed)
polytrim rx sent to pharmacy 

## 2015-03-12 NOTE — Telephone Encounter (Signed)
Patient aware.

## 2015-05-17 ENCOUNTER — Ambulatory Visit: Payer: Self-pay

## 2015-05-24 ENCOUNTER — Other Ambulatory Visit: Payer: Self-pay

## 2015-05-24 ENCOUNTER — Telehealth: Payer: Self-pay | Admitting: Nurse Practitioner

## 2015-05-24 ENCOUNTER — Ambulatory Visit (INDEPENDENT_AMBULATORY_CARE_PROVIDER_SITE_OTHER): Payer: BLUE CROSS/BLUE SHIELD | Admitting: *Deleted

## 2015-05-24 DIAGNOSIS — Z23 Encounter for immunization: Secondary | ICD-10-CM

## 2015-05-24 DIAGNOSIS — Z1231 Encounter for screening mammogram for malignant neoplasm of breast: Secondary | ICD-10-CM

## 2015-05-24 NOTE — Telephone Encounter (Signed)
Pt's ins does not require a referral. Recommended Dr. Nelva Bush w/ [facility]

## 2015-05-29 ENCOUNTER — Encounter: Payer: Self-pay | Admitting: Nurse Practitioner

## 2015-05-29 ENCOUNTER — Ambulatory Visit (INDEPENDENT_AMBULATORY_CARE_PROVIDER_SITE_OTHER): Payer: BLUE CROSS/BLUE SHIELD | Admitting: Nurse Practitioner

## 2015-05-29 ENCOUNTER — Telehealth: Payer: Self-pay | Admitting: Nurse Practitioner

## 2015-05-29 VITALS — BP 112/66 | HR 68 | Temp 97.2°F | Ht 68.0 in | Wt 138.0 lb

## 2015-05-29 DIAGNOSIS — J069 Acute upper respiratory infection, unspecified: Secondary | ICD-10-CM | POA: Diagnosis not present

## 2015-05-29 DIAGNOSIS — M542 Cervicalgia: Secondary | ICD-10-CM | POA: Diagnosis not present

## 2015-05-29 MED ORDER — CYCLOBENZAPRINE HCL 5 MG PO TABS: 5.0000 mg | ORAL_TABLET | Freq: Three times a day (TID) | ORAL | Status: DC | PRN

## 2015-05-29 MED ORDER — AZITHROMYCIN 250 MG PO TABS: ORAL_TABLET | ORAL | Status: DC

## 2015-05-29 MED ORDER — BENZONATATE 100 MG PO CAPS: 100.0000 mg | ORAL_CAPSULE | Freq: Three times a day (TID) | ORAL | Status: DC | PRN

## 2015-05-29 MED ORDER — NAPROXEN 500 MG PO TABS: 500.0000 mg | ORAL_TABLET | Freq: Two times a day (BID) | ORAL | Status: DC

## 2015-05-29 NOTE — Patient Instructions (Signed)
Upper Respiratory Infection, Adult Most upper respiratory infections (URIs) are a viral infection of the air passages leading to the lungs. A URI affects the nose, throat, and upper air passages. The most common type of URI is nasopharyngitis and is typically referred to as "the common cold." URIs run their course and usually go away on their own. Most of the time, a URI does not require medical attention, but sometimes a bacterial infection in the upper airways can follow a viral infection. This is called a secondary infection. Sinus and middle ear infections are common types of secondary upper respiratory infections. Bacterial pneumonia can also complicate a URI. A URI can worsen asthma and chronic obstructive pulmonary disease (COPD). Sometimes, these complications can require emergency medical care and may be life threatening.  CAUSES Almost all URIs are caused by viruses. A virus is a type of germ and can spread from one person to another.  RISKS FACTORS You may be at risk for a URI if:   You smoke.   You have chronic heart or lung disease.  You have a weakened defense (immune) system.   You are very young or very old.   You have nasal allergies or asthma.  You work in crowded or poorly ventilated areas.  You work in health care facilities or schools. SIGNS AND SYMPTOMS  Symptoms typically develop 2-3 days after you come in contact with a cold virus. Most viral URIs last 7-10 days. However, viral URIs from the influenza virus (flu virus) can last 14-18 days and are typically more severe. Symptoms may include:   Runny or stuffy (congested) nose.   Sneezing.   Cough.   Sore throat.   Headache.   Fatigue.   Fever.   Loss of appetite.   Pain in your forehead, behind your eyes, and over your cheekbones (sinus pain).  Muscle aches.  DIAGNOSIS  Your health care provider may diagnose a URI by:  Physical exam.  Tests to check that your symptoms are not due to  another condition such as:  Strep throat.  Sinusitis.  Pneumonia.  Asthma. TREATMENT  A URI goes away on its own with time. It cannot be cured with medicines, but medicines may be prescribed or recommended to relieve symptoms. Medicines may help:  Reduce your fever.  Reduce your cough.  Relieve nasal congestion. HOME CARE INSTRUCTIONS   Take medicines only as directed by your health care provider.   Gargle warm saltwater or take cough drops to comfort your throat as directed by your health care provider.  Use a warm mist humidifier or inhale steam from a shower to increase air moisture. This may make it easier to breathe.  Drink enough fluid to keep your urine clear or pale yellow.   Eat soups and other clear broths and maintain good nutrition.   Rest as needed.   Return to work when your temperature has returned to normal or as your health care provider advises. You may need to stay home longer to avoid infecting others. You can also use a face mask and careful hand washing to prevent spread of the virus.  Increase the usage of your inhaler if you have asthma.   Do not use any tobacco products, including cigarettes, chewing tobacco, or electronic cigarettes. If you need help quitting, ask your health care provider. PREVENTION  The best way to protect yourself from getting a cold is to practice good hygiene.   Avoid oral or hand contact with people with cold   symptoms.   Wash your hands often if contact occurs.  There is no clear evidence that vitamin C, vitamin E, echinacea, or exercise reduces the chance of developing a cold. However, it is always recommended to get plenty of rest, exercise, and practice good nutrition.  SEEK MEDICAL CARE IF:   You are getting worse rather than better.   Your symptoms are not controlled by medicine.   You have chills.  You have worsening shortness of breath.  You have brown or red mucus.  You have yellow or brown nasal  discharge.  You have pain in your face, especially when you bend forward.  You have a fever.  You have swollen neck glands.  You have pain while swallowing.  You have white areas in the back of your throat. SEEK IMMEDIATE MEDICAL CARE IF:   You have severe or persistent:  Headache.  Ear pain.  Sinus pain.  Chest pain.  You have chronic lung disease and any of the following:  Wheezing.  Prolonged cough.  Coughing up blood.  A change in your usual mucus.  You have a stiff neck.  You have changes in your:  Vision.  Hearing.  Thinking.  Mood. MAKE SURE YOU:   Understand these instructions.  Will watch your condition.  Will get help right away if you are not doing well or get worse.   This information is not intended to replace advice given to you by your health care provider. Make sure you discuss any questions you have with your health care provider.   Document Released: 01/21/2001 Document Revised: 12/12/2014 Document Reviewed: 11/02/2013 Elsevier Interactive Patient Education 2016 [employer]  

## 2015-05-29 NOTE — Progress Notes (Signed)
   Subjective:    Patient ID: Julie Cantrell, female    DOB: 1965-11-16, 49 y.o.   MRN: [id number]  HPI: Reports sinus pressure, chest congestion and cough for the past 2 weeks that has stayed the same. Productive cough, and post nasal drip. Taking benadryl at night, and cough is worse at night Also reports neck pain for the past month, denies any injury to the area. Reports her neck feels stiff at times, trying moist heat at night with some relief. No numbness or tingling into her arms or hands. Pain is worse on the right side.   Review of Systems  HENT: Positive for congestion, ear pain, postnasal drip, rhinorrhea and sinus pressure. Negative for tinnitus and voice change.   Respiratory: Positive for cough. Negative for chest tightness, shortness of breath and wheezing.   Cardiovascular: Negative for chest pain.  Neurological: Negative for dizziness and light-headedness.  All other systems reviewed and are negative.      Objective:   Physical Exam  Constitutional: She is oriented to person, place, and time. She appears well-developed and well-nourished.  HENT:  Head: Normocephalic.  Erythema noted to posterior pharynx  Eyes: Conjunctivae are normal. Pupils are equal, round, and reactive to light.  Neck: Normal range of motion. Neck supple.  Cardiovascular: Normal rate, regular rhythm and normal heart sounds.   Pulmonary/Chest: Effort normal and breath sounds normal.  Musculoskeletal: Normal range of motion.  Neurological: She is alert and oriented to person, place, and time. Coordination normal.  Skin: Skin is warm and dry.         BP 112/66 mmHg  Pulse 68  Temp(Src) 97.2 F (36.2 C) (Oral)  Ht 5\' 8"  (1.727 m)  Wt 138 lb (62.596 kg)  BMI 20.99 kg/m2  LMP 09/27/2010   Assessment & Plan:  1. Neck pain, bilateral Moist Heat, Follow up as needed - cyclobenzaprine (FLEXERIL) 5 MG tablet; Take 1 tablet (5 mg total) by mouth 3 (three) times daily as needed for muscle spasms.   Dispense: 15 tablet; Refill: 0 - naproxen (NAPROSYN) 500 MG tablet; Take 1 tablet (500 mg total) by mouth 2 (two) times daily with a meal.  Dispense: 30 tablet; Refill: 0  2. Acute upper respiratory infection 1. Take meds as prescribed 2. Use a cool mist humidifier especially during the winter months and when heat has been humid. 3. Use saline nose sprays frequently 4. Saline irrigations of the nose can be very helpful if done frequently.  * 4X daily for 1 week*  * Use of a nettie pot can be helpful with this. Follow directions with this* 5. Drink plenty of fluids 6. Keep thermostat turn down low 7.For any cough or congestion  Use plain Mucinex- regular strength or max strength is fine   * Children- consult with Pharmacist for dosing 8. For fever or aces or pains- take tylenol or ibuprofen appropriate for age and weight.  * for fevers greater than 101 orally you may alternate ibuprofen and tylenol every  3 hours.   - tessalon perles 100mg  1 po BID #20 0 refills - azithromycin (ZITHROMAX Z-PAK) 250 MG tablet; As directed  Dispense: 1 each; Refill: 0   Mary-Margaret Hassell Done, FNP

## 2015-05-29 NOTE — Telephone Encounter (Signed)
Pt given appt today with MMM at 3:45.

## 2015-06-18 ENCOUNTER — Ambulatory Visit (INDEPENDENT_AMBULATORY_CARE_PROVIDER_SITE_OTHER): Payer: BLUE CROSS/BLUE SHIELD | Admitting: Pediatrics

## 2015-06-18 ENCOUNTER — Encounter: Payer: Self-pay | Admitting: Pediatrics

## 2015-06-18 VITALS — BP 109/70 | HR 54 | Temp 97.3°F | Ht 68.0 in | Wt 140.6 lb

## 2015-06-18 DIAGNOSIS — M542 Cervicalgia: Secondary | ICD-10-CM | POA: Diagnosis not present

## 2015-06-18 MED ORDER — NAPROXEN 500 MG PO TABS: 500.0000 mg | ORAL_TABLET | Freq: Two times a day (BID) | ORAL | Status: DC

## 2015-06-18 MED ORDER — CYCLOBENZAPRINE HCL 5 MG PO TABS: 5.0000 mg | ORAL_TABLET | Freq: Every evening | ORAL | Status: DC | PRN

## 2015-06-18 NOTE — Patient Instructions (Signed)
Neck range of motion throughout the day Chin to chest Look over R and L shoulders Tilt head to L and R shoudlers

## 2015-06-18 NOTE — Progress Notes (Addendum)
Subjective:    Patient ID: Julie Cantrell, female    DOB: 23-Jan-1966, 49 y.o.   MRN: [id number]  CC: neck stiffness  HPI: Julie Cantrell is a 49 y.o. female presenting on 06/18/2015 for Neck Pain  Neck has been bothering her for past 2-3 months Started muscle relaxer and antiinflammatory, improved paon somewhat, but still occasionally comes back. Wakes her up at night. Bothers her on the sides of her neck and with ROM.  Can't pinpoint anything that causes it to flare. Bothers her all the time, doesn't take flexeril during day, pain doesn't go completely away. 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 Interim medical history since our last visit reviewed. Allergies and medications reviewed and updated.   ROS: Per HPI unless specifically indicated above  Past Medical History Patient Active Problem List   Diagnosis Date Noted  . Anxiety   . PVC's (premature ventricular contractions) 12/03/2010    Current Outpatient Prescriptions  Medication Sig Dispense Refill  . ALPRAZolam (XANAX) 0.25 MG tablet TAKE ONE TABLET AT BEDTIME AS NEEDED 30 tablet 2  . cyclobenzaprine (FLEXERIL) 5 MG tablet Take 1 tablet (5 mg total) by mouth at bedtime as needed for muscle spasms. 30 tablet 1  . Multiple Vitamin (MULTIVITAMIN) capsule Take 1 capsule by mouth daily.      . naproxen (NAPROSYN) 500 MG tablet Take 1 tablet (500 mg total) by mouth 2 (two) times daily with a meal. 60 tablet 1   No current facility-administered medications for this visit.       Objective:    BP 109/70 mmHg  Pulse 54  Temp(Src) 97.3 F (36.3 C) (Oral)  Ht 5\' 8"  (1.727 m)  Wt 140 lb 9.6 oz (63.776 kg)  BMI 21.38 kg/m2  LMP  09/27/2010  Wt Readings from Last 3 Encounters:  06/18/15 140 lb 9.6 oz (63.776 kg)  05/29/15 138 lb (62.596 kg)  06/20/14 141 lb (63.957 kg)     Gen: NAD, alert, cooperative with exam, NCAT EYES: EOMI, no scleral injection or icterus ENT:  TMs pearly gray b/l, OP without erythema LYMPH: no cervical LAD CV: WWP RESp:  normal WOB Ext: No edema, warm Neuro: Alert and oriented, strength equal b/l UE and LE, coordination grossly normal. Full sensation UE b/l. Patellar reflexes 2+ b/l. MSK: normal muscle bulk, no atrophy. 5/5 handgrip b/l, ext/flex at shoulders. No point tenderness over cervical spine. Decreased ROM of neck with turning head b/l, tilting head L and R and chin to chest. Feels stretching in trapezius msucle throughout ROM.     Assessment & Plan:   Julie Cantrell was seen today for neck pain. No weakness, tingling/sharp shooting pains through arms or hands. Continue below medicines, heating pads, start ROM exercises, also referral to PT.  Diagnoses and all orders for this visit:  Neck pain, bilateral -     cyclobenzaprine (FLEXERIL) 5 MG tablet; Take 1 tablet (5 mg total) by mouth at bedtime as needed for muscle spasms. -     naproxen (NAPROSYN) 500 MG tablet; Take 1 tablet (500 mg total) by mouth 2 (two) times daily with a meal. -     Ambulatory referral to Physical Therapy  Follow up plan: Return in about 4 weeks (around 07/16/2015).  Assunta Found, MD [facility] 06/18/2015, 2:59 PM

## 2015-08-09 ENCOUNTER — Ambulatory Visit (INDEPENDENT_AMBULATORY_CARE_PROVIDER_SITE_OTHER): Payer: BLUE CROSS/BLUE SHIELD | Admitting: Gynecology

## 2015-08-09 ENCOUNTER — Ambulatory Visit
Admission: RE | Admit: 2015-08-09 | Discharge: 2015-08-09 | Disposition: A | Discharge status: Home / Self Care | Payer: BLUE CROSS/BLUE SHIELD | Source: Ambulatory Visit

## 2015-08-09 ENCOUNTER — Encounter: Payer: Self-pay | Admitting: Gynecology

## 2015-08-09 VITALS — BP 118/82 | Wt 138.9 lb

## 2015-08-09 DIAGNOSIS — N912 Amenorrhea, unspecified: Secondary | ICD-10-CM | POA: Diagnosis not present

## 2015-08-09 DIAGNOSIS — Z01419 Encounter for gynecological examination (general) (routine) without abnormal findings: Secondary | ICD-10-CM | POA: Diagnosis not present

## 2015-08-09 DIAGNOSIS — Z1329 Encounter for screening for other suspected endocrine disorder: Secondary | ICD-10-CM | POA: Diagnosis not present

## 2015-08-09 DIAGNOSIS — Z1321 Encounter for screening for nutritional disorder: Secondary | ICD-10-CM

## 2015-08-09 DIAGNOSIS — Z1231 Encounter for screening mammogram for malignant neoplasm of breast: Secondary | ICD-10-CM

## 2015-08-09 DIAGNOSIS — Z13220 Encounter for screening for lipoid disorders: Secondary | ICD-10-CM | POA: Diagnosis not present

## 2015-08-09 LAB — CBC WITH DIFFERENTIAL/PLATELET
Basophils Absolute: 0 10*3/uL (ref 0.0–0.1)
Basophils Relative: 0 % (ref 0–1)
Eosinophils Absolute: 0.1 10*3/uL (ref 0.0–0.7)
Eosinophils Relative: 1 % (ref 0–5)
HEMATOCRIT: 34.1 % — AB (ref 36.0–46.0)
Hemoglobin: 11.5 g/dL — ABNORMAL LOW (ref 12.0–15.0)
LYMPHS PCT: 31 % (ref 12–46)
Lymphs Abs: 1.6 10*3/uL (ref 0.7–4.0)
MCH: 30.5 pg (ref 26.0–34.0)
MCHC: 33.7 g/dL (ref 30.0–36.0)
MCV: 90.5 fL (ref 78.0–100.0)
MONO ABS: 0.4 10*3/uL (ref 0.1–1.0)
MONOS PCT: 8 % (ref 3–12)
MPV: 9.1 fL (ref 8.6–12.4)
NEUTROS ABS: 3.2 10*3/uL (ref 1.7–7.7)
Neutrophils Relative %: 60 % (ref 43–77)
Platelets: 213 10*3/uL (ref 150–400)
RBC: 3.77 MIL/uL — ABNORMAL LOW (ref 3.87–5.11)
RDW: 12.5 % (ref 11.5–15.5)
WBC: 5.3 10*3/uL (ref 4.0–10.5)

## 2015-08-09 LAB — TSH: TSH: 1.891 u[IU]/mL (ref 0.350–4.500)

## 2015-08-09 MED ORDER — ALPRAZOLAM 0.25 MG PO TABS: ORAL_TABLET | ORAL | Status: DC

## 2015-08-09 NOTE — Progress Notes (Signed)
Julie Cantrell 1966/01/04 [id number]        49 y.o.  G1P1001  for annual exam.  Doing well without complaints  Past medical history,surgical history, problem list, medications, allergies, family history and social history were all reviewed and documented as reviewed in the EPIC chart.  ROS:  Performed with pertinent positives and negatives included in the history, assessment and plan.   Additional significant findings :  none   Exam: Programmer, multimedia Vitals:   08/09/15 1430  BP: 118/82  Weight: 138 lb 14.4 oz (63.005 kg)   General appearance:  Normal affect, orientation and appearance. Skin: Grossly normal HEENT: Without gross lesions.  No cervical or supraclavicular adenopathy. Thyroid normal.  Lungs:  Clear without wheezing, rales or rhonchi Cardiac: RR, without RMG Abdominal:  Soft, nontender, without masses, guarding, rebound, organomegaly or hernia Breasts:  Examined lying and sitting without masses, retractions, discharge or axillary adenopathy. Pelvic:  Ext/BUS/vagina normal  Adnexa  Without masses or tenderness    Anus and perineum  Normal   Rectovaginal  Normal sphincter tone without palpated masses or tenderness.    Assessment/Plan:  49 y.o. G33P1001 female for annual exam.   1. Status post [place] 2012 for  Menorrhagia/adenomyosis. Doing well without menopausal symptoms. We'll check baseline FSH to see where we stand. 2. Pap smear 2015. No Pap smear done today. 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 3. Family history of osteoporosis in her mother. We'll check [place] today. Check vitamin D level. Will plan DEXA at age 89. Increased calcium and vitamin D. 4. Anxiety. Patient uses Xanax 0.25 mg occasionally for anxiety. #30 with 2 refills provided. 5. Mammography scheduled today.  Has noticed some generalized bilateral breast tenderness over the past month or so but this seems to be getting better. We'll plan on a screening mammogram today and as long as normal and patient's  discomfort results will follow. If her discomfort continues she knows to call me. SBE monthly reviewed. 6. Health maintenance.  Baseline CBC, comprehensive metabolic panel, lipid profile, TSH, FSH, urinalysis ordered.  Follow up in one year, sooner as needed.   [name] MD, 2:48 PM 08/09/2015

## 2015-08-09 NOTE — Patient Instructions (Signed)

## 2015-08-10 ENCOUNTER — Other Ambulatory Visit: Payer: Self-pay | Admitting: Gynecology

## 2015-08-10 DIAGNOSIS — E559 Vitamin D deficiency, unspecified: Secondary | ICD-10-CM

## 2015-08-10 DIAGNOSIS — R7309 Other abnormal glucose: Secondary | ICD-10-CM

## 2015-08-10 LAB — COMPREHENSIVE METABOLIC PANEL
ALBUMIN: 3.9 g/dL (ref 3.6–5.1)
ALT: 11 U/L (ref 6–29)
AST: 16 U/L (ref 10–35)
Alkaline Phosphatase: 42 U/L (ref 33–130)
BILIRUBIN TOTAL: 0.7 mg/dL (ref 0.2–1.2)
BUN: 12 mg/dL (ref 7–25)
CO2: 21 mmol/L (ref 20–31)
CREATININE: 0.71 mg/dL (ref 0.50–1.05)
Calcium: 8.6 mg/dL (ref 8.6–10.4)
Chloride: 101 mmol/L (ref 98–110)
GLUCOSE: 121 mg/dL — AB (ref 65–99)
Potassium: 3.7 mmol/L (ref 3.5–5.3)
Sodium: 137 mmol/L (ref 135–146)
Total Protein: 6 g/dL — ABNORMAL LOW (ref 6.1–8.1)

## 2015-08-10 LAB — URINALYSIS W MICROSCOPIC + REFLEX CULTURE
BACTERIA UA: NONE SEEN [HPF]
Bilirubin Urine: NEGATIVE
CASTS: NONE SEEN [LPF]
Crystals: NONE SEEN [HPF]
GLUCOSE, UA: NEGATIVE
HGB URINE DIPSTICK: NEGATIVE
LEUKOCYTES UA: NEGATIVE
NITRITE: NEGATIVE
PH: 5.5 (ref 5.0–8.0)
PROTEIN: NEGATIVE
SQUAMOUS EPITHELIAL / LPF: NONE SEEN [HPF] (ref <=5)
Specific Gravity, Urine: 1.014 (ref 1.001–1.035)
WBC, UA: NONE SEEN WBC/HPF (ref <=5)
YEAST: NONE SEEN [HPF]

## 2015-08-10 LAB — LIPID PANEL
Cholesterol: 164 mg/dL (ref 125–200)
HDL: 55 mg/dL (ref >=46)
LDL CALC: 99 mg/dL (ref <130)
TRIGLYCERIDES: 52 mg/dL (ref <150)
Total CHOL/HDL Ratio: 3 Ratio (ref <=5.0)
VLDL: 10 mg/dL (ref <30)

## 2015-08-10 LAB — VITAMIN D 25 HYDROXY (VIT D DEFICIENCY, FRACTURES): Vit D, 25-Hydroxy: 29 ng/mL — ABNORMAL LOW (ref 30–100)

## 2015-08-10 LAB — FOLLICLE STIMULATING HORMONE: FSH: 11.9 m[IU]/mL

## 2015-08-11 LAB — URINE CULTURE

## 2015-09-13 ENCOUNTER — Other Ambulatory Visit: Payer: Self-pay | Admitting: Nurse Practitioner

## 2015-09-13 MED ORDER — OSELTAMIVIR PHOSPHATE 75 MG PO CAPS: 75.0000 mg | ORAL_CAPSULE | Freq: Every day | ORAL | Status: DC

## 2015-10-02 ENCOUNTER — Ambulatory Visit: Payer: BLUE CROSS/BLUE SHIELD | Admitting: Family Medicine

## 2015-10-02 ENCOUNTER — Ambulatory Visit (INDEPENDENT_AMBULATORY_CARE_PROVIDER_SITE_OTHER): Payer: BLUE CROSS/BLUE SHIELD | Admitting: Family Medicine

## 2015-10-02 ENCOUNTER — Encounter: Payer: Self-pay | Admitting: Family Medicine

## 2015-10-02 VITALS — BP 121/76 | HR 68 | Temp 98.4°F | Ht 68.0 in | Wt 139.0 lb

## 2015-10-02 DIAGNOSIS — J0141 Acute recurrent pansinusitis: Secondary | ICD-10-CM | POA: Diagnosis not present

## 2015-10-02 MED ORDER — PSEUDOEPHEDRINE-GUAIFENESIN ER 60-600 MG PO TB12: 1.0000 | ORAL_TABLET | Freq: Two times a day (BID) | ORAL | Status: AC

## 2015-10-02 MED ORDER — BENZONATATE 200 MG PO CAPS: 200.0000 mg | ORAL_CAPSULE | Freq: Three times a day (TID) | ORAL | Status: DC | PRN

## 2015-10-02 MED ORDER — BETAMETHASONE SOD PHOS & ACET 6 (3-3) MG/ML IJ SUSP
6.0000 mg | Freq: Once | INTRAMUSCULAR | Status: AC
  Administered 2015-10-02: 6 mg via INTRAMUSCULAR

## 2015-10-02 MED ORDER — AMOXICILLIN-POT CLAVULANATE 875-125 MG PO TABS: 1.0000 | ORAL_TABLET | Freq: Two times a day (BID) | ORAL | Status: DC

## 2015-10-02 NOTE — Addendum Note (Signed)
Addended by: Marin Olp on: 10/02/2015 06:21 PM   Modules accepted: Orders

## 2015-10-02 NOTE — Progress Notes (Signed)
Subjective:  Patient ID: Julie Cantrell, female    DOB: [birth year]  Age: 50 y.o. MRN: [id number]  CC: Sinusitis   HPI Julie Cantrell presents for Symptoms include congestion, facial pain, upper teeth hurt. Pain in the cheeks and forehead. Has nasal congestion, no  fever, non productive cough,purulent  post nasal drip and sinus pressure.  Onset of symptoms was a week ago. Started with HA 7 gradually worsening and adding other symptoms since that time.   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   Julie Cantrell has past surgical history that includes Nasal sinus surgery (1995); Kidney surgery (1985); and Laparoscopic total hysterectomy (08/2010).   Her family history includes Diabetes in her brother, father, and mother; Hypertension in her father and mother.Julie Cantrell reports that Julie Cantrell has never smoked. Julie Cantrell has never used smokeless tobacco. Julie Cantrell reports that Julie Cantrell drinks alcohol. Julie Cantrell reports that Julie Cantrell does not use illicit drugs.    ROS Review of Systems  Constitutional: Negative for fever, chills, activity change and appetite change.  HENT: Positive for congestion, postnasal drip, rhinorrhea and sinus pressure. Negative for ear discharge, ear pain, hearing loss, nosebleeds, sneezing and trouble swallowing.   Respiratory: Positive for cough. Negative for chest tightness and shortness of breath.   Cardiovascular: Negative for chest pain and palpitations.  Skin: Negative for rash.    Objective:  BP 121/76 mmHg  Pulse 68  Temp(Src) 98.4 F (36.9 C) (Oral)  Ht 5\' 8"  (1.727 m)  Wt 139 lb (63.05 kg)  BMI 21.14 kg/m2  SpO2 98%  LMP 09/27/2010  BP Readings from Last 3 Encounters:  10/02/15 121/76  08/09/15 118/82  06/18/15 109/70    Wt Readings from Last 3 Encounters:  10/02/15 139 lb (63.05  kg)  08/09/15 138 lb 14.4 oz (63.005 kg)  06/18/15 140 lb 9.6 oz (63.776 kg)     Physical Exam  Constitutional: Julie Cantrell appears well-developed and well-nourished.  HENT:  Head: Normocephalic and atraumatic.  Right Ear: Tympanic membrane and external ear normal. No decreased hearing is noted.  Left Ear: Tympanic membrane and external ear normal. No decreased hearing is noted.  Nose: Mucosal edema (and erythema) present. Right sinus exhibits maxillary sinus tenderness and frontal sinus tenderness. Left sinus exhibits maxillary sinus tenderness and frontal sinus tenderness.  Mouth/Throat: No oropharyngeal exudate or posterior oropharyngeal erythema.  Neck: No Brudzinski's sign noted.  Pulmonary/Chest: Breath sounds normal. No respiratory distress.  Lymphadenopathy:       Head (right side): No preauricular adenopathy present.       Head (left side): No preauricular adenopathy present.       Right cervical: No superficial cervical adenopathy present.      Left cervical: No superficial cervical adenopathy present.  Skin: Skin is warm and dry. No erythema.     Lab Results  Component Value Date   WBC 5.3 08/09/2015   HGB 11.5* 08/09/2015   HCT 34.1* 08/09/2015   PLT 213 08/09/2015   GLUCOSE 121* 08/09/2015   CHOL 164 08/09/2015   TRIG 52 08/09/2015   HDL 55 08/09/2015   LDLCALC 99 08/09/2015   ALT 11 08/09/2015   AST 16 08/09/2015   NA 137 08/09/2015   K 3.7 08/09/2015   CL 101 08/09/2015   CREATININE 0.71 08/09/2015   BUN 12 08/09/2015   CO2 21 08/09/2015   TSH  1.891 08/09/2015    Mm Screening Breast Tomo Bilateral  08/14/2015  CLINICAL DATA:  Screening. EXAM: DIGITAL SCREENING BILATERAL MAMMOGRAM WITH 3D TOMO WITH CAD COMPARISON:  Previous exam(s). ACR Breast Density Category d: The breast tissue is extremely dense, which lowers the sensitivity of mammography. FINDINGS: There are no findings suspicious for malignancy. Images were processed with CAD. IMPRESSION: No mammographic  evidence of malignancy. A result letter of this screening mammogram will be mailed directly to the patient. RECOMMENDATION: Screening mammogram in one year. (Code:SM-B-01Y) BI-RADS CATEGORY  1: Negative. Electronically Signed   By: Ammie Ferrier M.D.   On: 08/14/2015 08:06    Assessment & Plan:   Julie Cantrell was seen today for sinusitis.  Diagnoses and all orders for this visit:  Acute recurrent pansinusitis  Other orders -     amoxicillin-clavulanate (AUGMENTIN) 875-125 MG tablet; Take 1 tablet by mouth 2 (two) times daily. Take all of this medication -     pseudoephedrine-guaifenesin (MUCINEX D) 60-600 MG 12 hr tablet; Take 1 tablet by mouth every 12 (twelve) hours. As needed for congestion -     benzonatate (TESSALON) 200 MG capsule; Take 1 capsule (200 mg total) by mouth 3 (three) times daily as needed for cough.      I have discontinued Julie Cantrell's oseltamivir. I am also having her start on amoxicillin-clavulanate, pseudoephedrine-guaifenesin, and benzonatate. Additionally, I am having her maintain her multivitamin, cyclobenzaprine, naproxen, and ALPRAZolam.  Meds ordered this encounter  Medications  . amoxicillin-clavulanate (AUGMENTIN) 875-125 MG tablet    Sig: Take 1 tablet by mouth 2 (two) times daily. Take all of this medication    Dispense:  20 tablet    Refill:  0  . pseudoephedrine-guaifenesin (MUCINEX D) 60-600 MG 12 hr tablet    Sig: Take 1 tablet by mouth every 12 (twelve) hours. As needed for congestion    Dispense:  20 tablet    Refill:  0  . benzonatate (TESSALON) 200 MG capsule    Sig: Take 1 capsule (200 mg total) by mouth 3 (three) times daily as needed for cough.    Dispense:  20 capsule    Refill:  0     Follow-up: No Follow-up on file.  Claretta Fraise, M.D.

## 2015-10-08 ENCOUNTER — Telehealth: Payer: Self-pay | Admitting: Family Medicine

## 2015-10-08 DIAGNOSIS — B379 Candidiasis, unspecified: Secondary | ICD-10-CM

## 2015-10-08 MED ORDER — FLUCONAZOLE 150 MG PO TABS: 150.0000 mg | ORAL_TABLET | Freq: Once | ORAL | Status: DC

## 2015-10-08 NOTE — Telephone Encounter (Signed)
Pt aware.

## 2015-10-08 NOTE — Telephone Encounter (Signed)
Sent in diflucan to pharmacy

## 2015-10-09 ENCOUNTER — Other Ambulatory Visit: Payer: Self-pay | Admitting: Pediatrics

## 2015-11-06 ENCOUNTER — Ambulatory Visit (INDEPENDENT_AMBULATORY_CARE_PROVIDER_SITE_OTHER): Payer: BLUE CROSS/BLUE SHIELD | Admitting: Family Medicine

## 2015-11-06 ENCOUNTER — Other Ambulatory Visit: Payer: Self-pay

## 2015-11-06 ENCOUNTER — Encounter: Payer: Self-pay | Admitting: Family Medicine

## 2015-11-06 VITALS — BP 103/65 | HR 69 | Temp 101.0°F | Ht 68.0 in | Wt 134.4 lb

## 2015-11-06 DIAGNOSIS — J0111 Acute recurrent frontal sinusitis: Secondary | ICD-10-CM | POA: Diagnosis not present

## 2015-11-06 DIAGNOSIS — R6883 Chills (without fever): Secondary | ICD-10-CM | POA: Diagnosis not present

## 2015-11-06 DIAGNOSIS — R509 Fever, unspecified: Secondary | ICD-10-CM

## 2015-11-06 DIAGNOSIS — R52 Pain, unspecified: Secondary | ICD-10-CM

## 2015-11-06 LAB — VERITOR FLU A/B WAIVED
INFLUENZA A: NEGATIVE
INFLUENZA B: NEGATIVE

## 2015-11-06 MED ORDER — CEFDINIR 300 MG PO CAPS: 300.0000 mg | ORAL_CAPSULE | Freq: Two times a day (BID) | ORAL | Status: DC

## 2015-11-06 NOTE — Patient Instructions (Addendum)
Great to see you!  I am treating you for a sinus infection, try flonase 2 sprays per nostril daily to help reduce how often you have them  Your flu test was negative    Sinusitis, Adult Sinusitis is redness, soreness, and puffiness (inflammation) of the air pockets in the bones of your face (sinuses). The redness, soreness, and puffiness can cause air and mucus to get trapped in your sinuses. This can allow germs to grow and cause an infection.  HOME CARE   Drink enough fluids to keep your pee (urine) clear or pale yellow.  Use a humidifier in your home.  Run a hot shower to create steam in the bathroom. Sit in the bathroom with the door closed. Breathe in the steam 3-4 times a day.  Put a warm, moist washcloth on your face 3-4 times a day, or as told by your doctor.  Use salt water sprays (saline sprays) to wet the thick fluid in your nose. This can help the sinuses drain.  Only take medicine as told by your doctor. GET HELP RIGHT AWAY IF:   Your pain gets worse.  You have very bad headaches.  You are sick to your stomach (nauseous).  You throw up (vomit).  You are very sleepy (drowsy) all the time.  Your face is puffy (swollen).  Your vision changes.  You have a stiff neck.  You have trouble breathing. MAKE SURE YOU:   Understand these instructions.  Will watch your condition.  Will get help right away if you are not doing well or get worse.   This information is not intended to replace advice given to you by your health care provider. Make sure you discuss any questions you have with your health care provider.   Document Released: 01/14/2008 Document Revised: 08/18/2014 Document Reviewed: 03/02/2012 Elsevier Interactive Patient Education [employer].

## 2015-11-06 NOTE — Progress Notes (Signed)
   HPI  Patient presents today here with flulike symptoms and facial pain.  Patient explains that yesterday she developed cough, then she developed severe chills, body aches, facial pain, and nasal congestion. She's had subjective fever. She was treated for a sinus infection 4 weeks ago and completely resolved after that.  She is tolerating foods and fluids by mouth, she has decreased appetite She has no shortness of breath or chest pain. Her cough is nonproductive  PMH: Smoking status noted ROS: Per HPI  Objective: BP 103/65 mmHg  Pulse 69  Temp(Src) 101 F (38.3 C) (Oral)  Ht 5\' 8"  (1.727 m)  Wt 134 lb 6.4 oz (60.963 kg)  BMI 20.44 kg/m2  LMP 09/27/2010 Gen: NAD, alert, cooperative with exam HEENT: NCAT, nares with swollen turbinates bilaterally, TMs normal bilaterally, oropharynx clear, tenderness to palpation of all sinuses, most severe in the left frontal sinus CV: RRR, good S1/S2, no murmur Resp: CTABL, no wheezes, non-labored Ext: No edema, warm Neuro: Alert and oriented, No gross deficits  Assessment and plan:  # Acute sinusitis Frontal, Treat with omnicef RTC with any concerns or failure to improve Flu negative    Orders Placed This Encounter  Procedures  . Veritor Flu A/B Waived    Order Specific Question:  Source    Answer:  nose    Meds ordered this encounter  Medications  . cefdinir (OMNICEF) 300 MG capsule    Sig: Take 1 capsule (300 mg total) by mouth 2 (two) times daily. 1 po BID    Dispense:  20 capsule    Refill:  Stevens, MD [facility] 11/06/2015, 1:02 PM

## 2015-11-07 ENCOUNTER — Telehealth: Payer: Self-pay | Admitting: Family Medicine

## 2015-11-07 NOTE — Telephone Encounter (Signed)
Pt's husband is aware we don't have any openings for today or tomorrow, can you squeeze her in tomorrow possibly?

## 2015-11-07 NOTE — Telephone Encounter (Signed)
Apt made for tomorrow  

## 2015-11-07 NOTE — Telephone Encounter (Signed)
   She could be seen tomorrow at 1125 if she is getting worse.    Laroy Apple, MD [facility] 11/07/2015, 4:53 PM

## 2015-11-08 ENCOUNTER — Ambulatory Visit: Payer: Self-pay | Admitting: Family Medicine

## 2015-11-08 ENCOUNTER — Encounter: Payer: Self-pay | Admitting: Family Medicine

## 2015-11-08 ENCOUNTER — Ambulatory Visit (INDEPENDENT_AMBULATORY_CARE_PROVIDER_SITE_OTHER): Payer: BLUE CROSS/BLUE SHIELD | Admitting: Family Medicine

## 2015-11-08 VITALS — BP 118/72 | HR 85 | Temp 97.2°F | Ht 68.0 in | Wt 130.4 lb

## 2015-11-08 DIAGNOSIS — R0981 Nasal congestion: Secondary | ICD-10-CM | POA: Diagnosis not present

## 2015-11-08 DIAGNOSIS — R059 Cough, unspecified: Secondary | ICD-10-CM

## 2015-11-08 DIAGNOSIS — J111 Influenza due to unidentified influenza virus with other respiratory manifestations: Secondary | ICD-10-CM | POA: Diagnosis not present

## 2015-11-08 DIAGNOSIS — J019 Acute sinusitis, unspecified: Secondary | ICD-10-CM | POA: Diagnosis not present

## 2015-11-08 DIAGNOSIS — R6883 Chills (without fever): Secondary | ICD-10-CM | POA: Diagnosis not present

## 2015-11-08 DIAGNOSIS — R05 Cough: Secondary | ICD-10-CM | POA: Diagnosis not present

## 2015-11-08 MED ORDER — OSELTAMIVIR PHOSPHATE 75 MG PO CAPS: 75.0000 mg | ORAL_CAPSULE | Freq: Two times a day (BID) | ORAL | Status: DC

## 2015-11-08 MED ORDER — METHYLPREDNISOLONE ACETATE 80 MG/ML IJ SUSP
80.0000 mg | Freq: Once | INTRAMUSCULAR | Status: AC
  Administered 2015-11-08: 80 mg via INTRAMUSCULAR

## 2015-11-08 MED ORDER — FLUCONAZOLE 150 MG PO TABS: 150.0000 mg | ORAL_TABLET | Freq: Once | ORAL | Status: DC

## 2015-11-08 NOTE — Progress Notes (Signed)
   HPI  Patient presents today with persistent illness.  Patient was seen 2 days ago when she had a fever of 101 and symptoms of acute sinusitis Her flu test at that time was negative. Since then she continues to have facial pain, cough, body aches, dental pain, and chills. She also has chest pain with cough. She has no shortness of breath. She has significant malaise and fatigue.  She is tolerating Omnicef, however she is developing a yeast infection. She has continued to miss work due to her significant malaise.   PMH: Smoking status noted ROS: Per HPI  Objective: BP 118/72 mmHg  Pulse 85  Temp(Src) 97.2 F (36.2 C) (Oral)  Ht 5\' 8"  (1.727 m)  Wt 130 lb 6.4 oz (59.149 kg)  BMI 19.83 kg/m2  LMP 09/27/2010 Gen: NAD, alert, cooperative with exam HEENT: NCAT, mild tenderness to palpation of maxillary sinuses bilaterally, nares with erythema, oropharynx moist CV: RRR, good S1/S2, no murmur Resp: CTABL, no wheezes, non-labored Ext: No edema, warm Neuro: Alert and oriented, No gross deficits  Assessment and plan:  # Acute sinusitis, influenza Her rapid flu test was -2 days ago, however in hindsight with body aches and true fever with persistent symptoms today I believe she likely has influenza rapid test missed. Start Tamiflu given her significant course Given a shot of Depo-Medrol to help her sinus pain. Extended her time out of work, from 7 days of onset of symptoms Return to clinic with any concerns, worsening symptoms, or failure to improve as expected   Meds ordered this encounter  Medications  . fluconazole (DIFLUCAN) 150 MG tablet    Sig: Take 1 tablet (150 mg total) by mouth once. Repeat 3 days later    Dispense:  2 tablet    Refill:  0  . oseltamivir (TAMIFLU) 75 MG capsule    Sig: Take 1 capsule (75 mg total) by mouth 2 (two) times daily.    Dispense:  10 capsule    Refill:  0  . methylPREDNISolone acetate (DEPO-MEDROL) injection 80 mg    Sig:     Laroy Apple, MD [facility] 11/08/2015, 12:05 PM

## 2015-11-08 NOTE — Patient Instructions (Signed)
Great to see you!  I think you have the flu and a sinus infection. The shot will help the pain and the tamiflu will help you clear the virus faster. It still may take a few days.   Influenza, Adult Influenza ("the flu") is a viral infection of the respiratory tract. It occurs more often in winter months because people spend more time in close contact with one another. Influenza can make you feel very sick. Influenza easily spreads from person to person (contagious). CAUSES  Influenza is caused by a virus that infects the respiratory tract. You can catch the virus by breathing in droplets from an infected person's cough or sneeze. You can also catch the virus by touching something that was recently contaminated with the virus and then touching your mouth, nose, or eyes. RISKS AND COMPLICATIONS You may be at risk for a more severe case of influenza if you smoke cigarettes, have diabetes, have chronic heart disease (such as heart failure) or lung disease (such as asthma), or if you have a weakened immune system. Elderly people and pregnant women are also at risk for more serious infections. The most common problem of influenza is a lung infection (pneumonia). Sometimes, this problem can require emergency medical care and may be life threatening. SIGNS AND SYMPTOMS  Symptoms typically last 4 to 10 days and may include:  Fever.  Chills.  Headache, body aches, and muscle aches.  Sore throat.  Chest discomfort and cough.  Poor appetite.  Weakness or feeling tired.  Dizziness.  Nausea or vomiting. DIAGNOSIS  Diagnosis of influenza is often made based on your history and a physical exam. A nose or throat swab test can be done to confirm the diagnosis. TREATMENT  In mild cases, influenza goes away on its own. Treatment is directed at relieving symptoms. For more severe cases, your health care provider may prescribe antiviral medicines to shorten the sickness. Antibiotic medicines are not  effective because the infection is caused by a virus, not by bacteria. HOME CARE INSTRUCTIONS  Take medicines only as directed by your health care provider.  Use a cool mist humidifier to make breathing easier.  Get plenty of rest until your temperature returns to normal. This usually takes 3 to 4 days.  Drink enough fluid to keep your urine clear or pale yellow.  Cover yourmouth and nosewhen coughing or sneezing,and wash your handswellto prevent thevirusfrom spreading.  Stay homefromwork orschool untilthe fever is gonefor at least 68full day. PREVENTION  An annual influenza vaccination (flu shot) is the best way to avoid getting influenza. An annual flu shot is now routinely recommended for all adults in the [place] IF:  You experiencechest pain, yourcough worsens,or you producemore mucus.  Youhave nausea,vomiting, ordiarrhea.  Your fever returns or gets worse. SEEK IMMEDIATE MEDICAL CARE IF:  You havetrouble breathing, you become short of breath,or your skin ornails becomebluish.  You have severe painor stiffnessin the neck.  You develop a sudden headache, or pain in the face or ear.  You have nausea or vomiting that you cannot control. MAKE SURE YOU:   Understand these instructions.  Will watch your condition.  Will get help right away if you are not doing well or get worse.   This information is not intended to replace advice given to you by your health care provider. Make sure you discuss any questions you have with your health care provider.   Document Released: 07/25/2000 Document Revised: 08/18/2014 Document Reviewed: 10/27/2011 Elsevier Interactive  Patient Education 2016 [employer].

## 2015-11-12 ENCOUNTER — Ambulatory Visit: Payer: Self-pay | Admitting: Family Medicine

## 2015-11-22 ENCOUNTER — Telehealth: Payer: Self-pay | Admitting: *Deleted

## 2015-11-22 DIAGNOSIS — N951 Menopausal and female climacteric states: Secondary | ICD-10-CM

## 2015-11-22 NOTE — Telephone Encounter (Signed)
Okay to add [facility]

## 2015-11-22 NOTE — Telephone Encounter (Signed)
Pt is coming in on 12/05/15 to have repeat labs drawn, pt said for about 8 weeks now she has experienced hot flashes. Had normal [place] level in Dec 2016,pt asked if it would be wise to recheck [facility] level this day as well? Please advise

## 2015-11-22 NOTE — Telephone Encounter (Signed)
Pt aware, order added to previous orders

## 2015-12-05 ENCOUNTER — Other Ambulatory Visit: Payer: BLUE CROSS/BLUE SHIELD

## 2015-12-05 DIAGNOSIS — N951 Menopausal and female climacteric states: Secondary | ICD-10-CM

## 2015-12-05 DIAGNOSIS — E559 Vitamin D deficiency, unspecified: Secondary | ICD-10-CM

## 2015-12-05 DIAGNOSIS — R7309 Other abnormal glucose: Secondary | ICD-10-CM

## 2015-12-05 LAB — HEMOGLOBIN A1C
HEMOGLOBIN A1C: 5.4 % (ref <5.7)
Mean Plasma Glucose: 108 mg/dL

## 2015-12-06 LAB — VITAMIN D 25 HYDROXY (VIT D DEFICIENCY, FRACTURES): VIT D 25 HYDROXY: 32 ng/mL (ref 30–100)

## 2015-12-06 LAB — GLUCOSE, FASTING: Glucose, Fasting: 89 mg/dL (ref 65–99)

## 2015-12-06 LAB — FOLLICLE STIMULATING HORMONE: FSH: 145.1 m[IU]/mL — AB

## 2016-02-19 ENCOUNTER — Other Ambulatory Visit: Payer: Self-pay | Admitting: Gynecology

## 2016-02-19 DIAGNOSIS — Z1231 Encounter for screening mammogram for malignant neoplasm of breast: Secondary | ICD-10-CM

## 2016-04-24 DIAGNOSIS — Z23 Encounter for immunization: Secondary | ICD-10-CM | POA: Diagnosis not present

## 2016-08-12 ENCOUNTER — Ambulatory Visit (INDEPENDENT_AMBULATORY_CARE_PROVIDER_SITE_OTHER): Payer: BLUE CROSS/BLUE SHIELD | Admitting: Gynecology

## 2016-08-12 ENCOUNTER — Encounter: Payer: Self-pay | Admitting: Gynecology

## 2016-08-12 ENCOUNTER — Ambulatory Visit
Admission: RE | Admit: 2016-08-12 | Discharge: 2016-08-12 | Disposition: A | Discharge status: Home / Self Care | Payer: BLUE CROSS/BLUE SHIELD | Source: Ambulatory Visit | Attending: Gynecology | Admitting: Gynecology

## 2016-08-12 VITALS — BP 116/70 | Ht 68.0 in | Wt 143.0 lb

## 2016-08-12 DIAGNOSIS — Z01419 Encounter for gynecological examination (general) (routine) without abnormal findings: Secondary | ICD-10-CM | POA: Diagnosis not present

## 2016-08-12 DIAGNOSIS — Z1231 Encounter for screening mammogram for malignant neoplasm of breast: Secondary | ICD-10-CM

## 2016-08-12 DIAGNOSIS — Z1329 Encounter for screening for other suspected endocrine disorder: Secondary | ICD-10-CM | POA: Diagnosis not present

## 2016-08-12 DIAGNOSIS — E559 Vitamin D deficiency, unspecified: Secondary | ICD-10-CM | POA: Diagnosis not present

## 2016-08-12 DIAGNOSIS — Z13220 Encounter for screening for lipoid disorders: Secondary | ICD-10-CM

## 2016-08-12 DIAGNOSIS — N951 Menopausal and female climacteric states: Secondary | ICD-10-CM

## 2016-08-12 LAB — CBC WITH DIFFERENTIAL/PLATELET
BASOS ABS: 0 {cells}/uL (ref 0–200)
Basophils Relative: 0 %
EOS PCT: 2 %
Eosinophils Absolute: 120 cells/uL (ref 15–500)
HCT: 37.3 % (ref 35.0–45.0)
HEMOGLOBIN: 12.4 g/dL (ref 11.7–15.5)
LYMPHS PCT: 30 %
Lymphs Abs: 1800 cells/uL (ref 850–3900)
MCH: 30.2 pg (ref 27.0–33.0)
MCHC: 33.2 g/dL (ref 32.0–36.0)
MCV: 91 fL (ref 80.0–100.0)
MONOS PCT: 9 %
MPV: 9.4 fL (ref 7.5–12.5)
Monocytes Absolute: 540 cells/uL (ref 200–950)
NEUTROS PCT: 59 %
Neutro Abs: 3540 cells/uL (ref 1500–7800)
PLATELETS: 218 10*3/uL (ref 140–400)
RBC: 4.1 MIL/uL (ref 3.80–5.10)
RDW: 13 % (ref 11.0–15.0)
WBC: 6 10*3/uL (ref 3.8–10.8)

## 2016-08-12 MED ORDER — ALPRAZOLAM 0.25 MG PO TABS: ORAL_TABLET | ORAL | 2 refills | Status: DC

## 2016-08-12 MED ORDER — ESTRADIOL 0.1 MG/24HR TD PTTW: 1.0000 | MEDICATED_PATCH | TRANSDERMAL | 12 refills | Status: DC

## 2016-08-12 NOTE — Progress Notes (Signed)
    Julie Cantrell June 02, 1966 [id number]        51 y.o.  G1P1001 for annual exam.  Also complaining of worsening hot flushes, night sweats and sleep disturbance. Has progressively gotten worse of the past 6 months. Now interfering with her life. Recent FSH 145 in April 2017. 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  Past medical history,surgical history, problem list, medications, allergies, family history and social history were all reviewed and documented as reviewed in the EPIC chart.  ROS:  Performed with pertinent positives and negatives included in the history, assessment and plan.   Additional significant findings :  None   Exam: [name] assistant Vitals:   08/12/16 1423  BP: 116/70  Weight: 143 lb (64.9 kg)  Height: 5\' 8"  (1.727 m)   Body mass index is 21.74 kg/m.  General appearance:  Normal affect, orientation and appearance. Skin: Grossly normal HEENT: Without gross lesions.  No cervical or supraclavicular adenopathy. Thyroid normal.  Lungs:  Clear without wheezing, rales or rhonchi Cardiac: RR, without RMG Abdominal:  Soft, nontender, without masses, guarding, rebound, organomegaly or hernia Breasts:  Examined lying and sitting without masses, retractions, discharge or axillary adenopathy. Pelvic:  Ext, BUS, Vagina normal  Adnexa without masses or tenderness    Anus and perineum normal   Rectovaginal normal sphincter tone without palpated masses or tenderness.    Assessment/Plan:  51 y.o. G28P1001 female for annual exam.   1. Menopausal symptoms. Classic in their description to include hot flushes night sweats and difficulty sleeping. Options reviewed to include OTC products such as soy based, pharmacologic nonhormonal such as Effexor and HRT. I reviewed the 2017 NAMS guidelines on HRT, risks versus benefits. Symptom relief, possible cardiovascular/bone health benefits when started early versus risks to include stroke heart attack DVT and possible breast  cancer issues. Patient wants to go ahead and initiate. Feels it is a quality-of-life issue. Various routes to include oral, transdermal, transvaginal discussed. Will start with Vivelle 0.1 mg patches twice weekly. Will call me if she has any issues or questions with this. 2. Mammography scheduled today. SBE monthly reviewed. 3. Pap smear 2015. No Pap smear done today. 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 4. Colonoscopy never. Recommend scheduling screening colonoscopy as she has turned 49. Names and numbers provided for her to call and arrange. 5. Anxiety. Patient uses Xanax 0.25 mg occasionally. #30 with 2 refills provided. Does well with this. 6. Health maintenance. Baseline CBC, comprehensive metabolic panel, lipid profile, TSH, urinalysis and vitamin D ordered. Was borderline low on vitamin D previously and is taking a supplement now. Follow up if any issues or questions as far as HRT. Follow up in one year for annual exam.     [name] MD, 3:01 PM 08/12/2016

## 2016-08-12 NOTE — Patient Instructions (Signed)
Schedule your colonoscopy with either:  [facility]   Address: [address], [place], [place] [address]  [phone]    or  [facility]  Address: [address], [place], [place] [address]  [phone]      

## 2016-08-13 ENCOUNTER — Encounter: Payer: Self-pay | Admitting: Gynecology

## 2016-08-13 LAB — VITAMIN D 25 HYDROXY (VIT D DEFICIENCY, FRACTURES): Vit D, 25-Hydroxy: 32 ng/mL (ref 30–100)

## 2016-08-13 LAB — COMPREHENSIVE METABOLIC PANEL
ALBUMIN: 4.2 g/dL (ref 3.6–5.1)
ALK PHOS: 67 U/L (ref 33–130)
ALT: 9 U/L (ref 6–29)
AST: 15 U/L (ref 10–35)
BILIRUBIN TOTAL: 0.4 mg/dL (ref 0.2–1.2)
BUN: 20 mg/dL (ref 7–25)
CALCIUM: 8.9 mg/dL (ref 8.6–10.4)
CO2: 25 mmol/L (ref 20–31)
Chloride: 104 mmol/L (ref 98–110)
Creat: 0.64 mg/dL (ref 0.50–1.05)
Glucose, Bld: 82 mg/dL (ref 65–99)
Potassium: 3.9 mmol/L (ref 3.5–5.3)
Sodium: 137 mmol/L (ref 135–146)
TOTAL PROTEIN: 6.5 g/dL (ref 6.1–8.1)

## 2016-08-13 LAB — LIPID PANEL
Cholesterol: 180 mg/dL (ref <200)
HDL: 55 mg/dL (ref >50)
LDL CALC: 98 mg/dL (ref <100)
TRIGLYCERIDES: 135 mg/dL (ref <150)
Total CHOL/HDL Ratio: 3.3 Ratio (ref <5.0)
VLDL: 27 mg/dL (ref <30)

## 2016-08-13 LAB — URINALYSIS W MICROSCOPIC + REFLEX CULTURE
BACTERIA UA: NONE SEEN [HPF]
Bilirubin Urine: NEGATIVE
Casts: NONE SEEN [LPF]
Crystals: NONE SEEN [HPF]
GLUCOSE, UA: NEGATIVE
Hgb urine dipstick: NEGATIVE
Ketones, ur: NEGATIVE
LEUKOCYTES UA: NEGATIVE
NITRITE: NEGATIVE
PH: 6 (ref 5.0–8.0)
PROTEIN: NEGATIVE
RBC / HPF: NONE SEEN RBC/HPF (ref <=2)
SPECIFIC GRAVITY, URINE: 1.01 (ref 1.001–1.035)
Squamous Epithelial / LPF: NONE SEEN [HPF] (ref <=5)
WBC, UA: NONE SEEN WBC/HPF (ref <=5)
YEAST: NONE SEEN [HPF]

## 2016-08-13 LAB — TSH: TSH: 2.68 mIU/L

## 2016-08-14 ENCOUNTER — Other Ambulatory Visit: Payer: Self-pay | Admitting: Gynecology

## 2016-08-14 DIAGNOSIS — R928 Other abnormal and inconclusive findings on diagnostic imaging of breast: Secondary | ICD-10-CM

## 2016-08-18 ENCOUNTER — Ambulatory Visit
Admission: RE | Admit: 2016-08-18 | Discharge: 2016-08-18 | Disposition: A | Discharge status: Home / Self Care | Payer: BLUE CROSS/BLUE SHIELD | Source: Ambulatory Visit | Attending: Gynecology | Admitting: Gynecology

## 2016-08-18 ENCOUNTER — Other Ambulatory Visit: Payer: BLUE CROSS/BLUE SHIELD

## 2016-08-18 DIAGNOSIS — R922 Inconclusive mammogram: Secondary | ICD-10-CM | POA: Diagnosis not present

## 2016-08-18 DIAGNOSIS — R928 Other abnormal and inconclusive findings on diagnostic imaging of breast: Secondary | ICD-10-CM

## 2016-09-12 ENCOUNTER — Telehealth: Payer: Self-pay | Admitting: Nurse Practitioner

## 2016-09-12 MED ORDER — OSELTAMIVIR PHOSPHATE 75 MG PO CAPS: 75.0000 mg | ORAL_CAPSULE | Freq: Every day | ORAL | 0 refills | Status: DC

## 2016-09-12 NOTE — Telephone Encounter (Signed)
Tamiflu Prescription sent to pharmacy   

## 2016-09-12 NOTE — Telephone Encounter (Signed)
Patient aware that medication has been sent to pharmacy 

## 2016-10-30 ENCOUNTER — Encounter: Payer: Self-pay | Admitting: Nurse Practitioner

## 2016-10-30 ENCOUNTER — Ambulatory Visit (INDEPENDENT_AMBULATORY_CARE_PROVIDER_SITE_OTHER): Payer: BLUE CROSS/BLUE SHIELD | Admitting: Nurse Practitioner

## 2016-10-30 VITALS — BP 125/73 | HR 60 | Temp 96.8°F | Ht 68.0 in | Wt 140.0 lb

## 2016-10-30 DIAGNOSIS — M5431 Sciatica, right side: Secondary | ICD-10-CM | POA: Diagnosis not present

## 2016-10-30 MED ORDER — CYCLOBENZAPRINE HCL 5 MG PO TABS: 5.0000 mg | ORAL_TABLET | Freq: Three times a day (TID) | ORAL | 0 refills | Status: DC | PRN

## 2016-10-30 MED ORDER — PREDNISONE 10 MG (21) PO TBPK: ORAL_TABLET | ORAL | 0 refills | Status: DC

## 2016-10-30 NOTE — Patient Instructions (Signed)

## 2016-10-30 NOTE — Progress Notes (Signed)
   Subjective:    Patient ID: Julie Cantrell, female    DOB: 1966-02-11, 51 y.o.   MRN: [id number]  HPI Patient presents today with complaints of pain in her right lower lumbar with radiation into her right hip and right coccyx. Pain has been intermitted since being diagnosed with a back muscle strain roughly 2 years ago but has increased in recent days.   Review of Systems  Constitutional: Negative.   HENT: Negative.   Respiratory: Negative.  Negative for shortness of breath.   Cardiovascular: Negative.  Negative for chest pain.  Gastrointestinal: Negative.  Negative for abdominal pain, diarrhea and nausea.  Genitourinary: Negative for decreased urine volume, dysuria and flank pain.  Musculoskeletal: Positive for back pain.  Psychiatric/Behavioral: Negative.        Objective:   Physical Exam  Constitutional: She is oriented to person, place, and time. She appears well-developed and well-nourished.  HENT:  Right Ear: External ear normal.  Left Ear: External ear normal.  Eyes: Pupils are equal, round, and reactive to light.  Neck: Normal range of motion.  Cardiovascular: Normal rate and regular rhythm.   Pulmonary/Chest: Effort normal and breath sounds normal.  Abdominal: Soft. There is no tenderness.  Genitourinary:  Genitourinary Comments: No CVA tenderness present  Musculoskeletal: She exhibits no tenderness.  Neurological: She is oriented to person, place, and time.  Skin: Skin is warm and dry.  Psychiatric: She has a normal mood and affect. Her behavior is normal. Judgment and thought content normal.   BP 125/73   Pulse 60   Temp (!) 96.8 F (36 C) (Oral)   Ht 5\' 8"  (1.727 m)   Wt 140 lb (63.5 kg)   LMP 09/27/2010   BMI 21.29 kg/m      Assessment & Plan:  1. Sciatica of right side Back exercises for stretching discussed and encouraged.  - cyclobenzaprine (FLEXERIL) 5 MG tablet; Take 1 tablet (5 mg total) by mouth 3 (three) times daily as needed for muscle spasms.   Dispense: 60 tablet; Refill: 0 - predniSONE (STERAPRED UNI-PAK 21 TAB) 10 MG (21) TBPK tablet; As directed x 6 days  Dispense: 21 tablet; Refill: 0  Dionisio David, FNP student [place] [employer], [place]

## 2017-05-04 ENCOUNTER — Telehealth: Payer: Self-pay | Admitting: Nurse Practitioner

## 2017-05-04 ENCOUNTER — Other Ambulatory Visit: Payer: Self-pay | Admitting: Gynecology

## 2017-05-04 DIAGNOSIS — Z1231 Encounter for screening mammogram for malignant neoplasm of breast: Secondary | ICD-10-CM

## 2017-05-04 NOTE — Telephone Encounter (Signed)
Will need to be seen here again for referral

## 2017-05-04 NOTE — Telephone Encounter (Signed)
What type of referral do you need? For sciatic pain  Have you been seen at our office for this problem? yes (If no, schedule them an appointment.  They will need to be seen before a referral can be done.)  Is there a particular doctor or location that you prefer?  [place]  Patient notified that referrals can take up to a week or longer to process. If they haven't heard anything within a week they should call back and speak with the referral department.

## 2017-05-04 NOTE — Telephone Encounter (Signed)
Patient notified that she needs to be seen. Patient advised that she will check her calender and call us back for an appt

## 2017-05-04 NOTE — Telephone Encounter (Signed)
Please advise 

## 2017-06-09 ENCOUNTER — Ambulatory Visit (INDEPENDENT_AMBULATORY_CARE_PROVIDER_SITE_OTHER): Payer: BLUE CROSS/BLUE SHIELD | Admitting: *Deleted

## 2017-06-09 DIAGNOSIS — Z23 Encounter for immunization: Secondary | ICD-10-CM

## 2017-08-05 ENCOUNTER — Ambulatory Visit: Payer: BLUE CROSS/BLUE SHIELD | Admitting: Family Medicine

## 2017-08-05 ENCOUNTER — Encounter: Payer: Self-pay | Admitting: Family Medicine

## 2017-08-05 ENCOUNTER — Ambulatory Visit (INDEPENDENT_AMBULATORY_CARE_PROVIDER_SITE_OTHER): Payer: BLUE CROSS/BLUE SHIELD

## 2017-08-05 VITALS — BP 133/79 | HR 66 | Temp 97.8°F | Ht 68.0 in | Wt 138.0 lb

## 2017-08-05 DIAGNOSIS — M25561 Pain in right knee: Secondary | ICD-10-CM

## 2017-08-05 DIAGNOSIS — S8001XA Contusion of right knee, initial encounter: Secondary | ICD-10-CM | POA: Diagnosis not present

## 2017-08-05 DIAGNOSIS — S99912A Unspecified injury of left ankle, initial encounter: Secondary | ICD-10-CM | POA: Diagnosis not present

## 2017-08-05 DIAGNOSIS — S99921A Unspecified injury of right foot, initial encounter: Secondary | ICD-10-CM | POA: Diagnosis not present

## 2017-08-05 DIAGNOSIS — S8011XA Contusion of right lower leg, initial encounter: Secondary | ICD-10-CM

## 2017-08-05 DIAGNOSIS — M25572 Pain in left ankle and joints of left foot: Secondary | ICD-10-CM | POA: Diagnosis not present

## 2017-08-05 DIAGNOSIS — W19XXXA Unspecified fall, initial encounter: Secondary | ICD-10-CM

## 2017-08-05 DIAGNOSIS — M79671 Pain in right foot: Secondary | ICD-10-CM

## 2017-08-05 DIAGNOSIS — S9000XA Contusion of unspecified ankle, initial encounter: Secondary | ICD-10-CM | POA: Diagnosis not present

## 2017-08-05 DIAGNOSIS — S8991XA Unspecified injury of right lower leg, initial encounter: Secondary | ICD-10-CM | POA: Diagnosis not present

## 2017-08-05 MED ORDER — DICLOFENAC SODIUM 75 MG PO TBEC: 75.0000 mg | DELAYED_RELEASE_TABLET | Freq: Two times a day (BID) | ORAL | 0 refills | Status: DC

## 2017-08-05 NOTE — Progress Notes (Signed)
Subjective:  Patient ID: MARCIA HARTWELL, female    DOB: August 11, 1966  Age: 51 y.o. MRN: [id number]  CC: Ankle Pain (left ankle pain s/p fall about 1.5 hrs ago) and Knee Pain (right knee)   HPI CELA NEWCOM presents for falling down last 2-3 steps and twisting right knee and left ankle. Ambulation is painful mostly at the left ankle - somewhat at the right ankle too. Pain is sharp, moderate severity. Occurred 2 hours ago.    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   She has a past surgical history that includes Nasal sinus surgery (1995); Kidney surgery (1985); and Laparoscopic total hysterectomy (08/2010).   Her family history includes Diabetes in her brother, father, and mother; Hypertension in her father and mother.She reports that  has never smoked. she has never used smokeless tobacco. She reports that she drinks alcohol. She reports that she does not use drugs.    ROS Review of Systems  Constitutional: Negative for activity change, appetite change and fever.  HENT: Negative for congestion, rhinorrhea and sore throat.   Eyes: Negative for visual disturbance.  Respiratory: Negative for cough and shortness of breath.   Cardiovascular: Negative for chest pain and palpitations.  Gastrointestinal: Negative for abdominal pain, diarrhea and nausea.  Genitourinary: Negative for dysuria.  Musculoskeletal: Positive for arthralgias and gait problem. Negative for myalgias.    Objective:  BP 133/79 (BP Location: Right Arm, Patient Position: Sitting, Cuff Size: Normal)   Pulse 66   Temp 97.8 F (36.6 C) (Oral)   Ht 5\' 8"  (1.727 m)   Wt  138 lb (62.6 kg)   LMP 09/27/2010   BMI 20.98 kg/m   BP Readings from Last 3 Encounters:  08/05/17 133/79  10/30/16 125/73  08/12/16 116/70    Wt Readings from Last 3 Encounters:  08/05/17 138 lb (62.6 kg)  10/30/16 140 lb (63.5 kg)  08/12/16 143 lb (64.9 kg)     Physical Exam  Constitutional: She is oriented to person, place, and time. She appears well-developed and well-nourished.  HENT:  Head: Normocephalic.  Cardiovascular: Normal rate and regular rhythm.  No murmur heard. Pulmonary/Chest: Effort normal and breath sounds normal.  Musculoskeletal: She exhibits edema (1-2+ at left deltid ligamanet. ) and tenderness (forinversionn and everion of the left ankle. NV intact LLE. Also, tender for palpation of medial joint line R knee. ). She exhibits no deformity.  Neurological: She is alert and oriented to person, place, and time.  Skin: Skin is warm and dry.  Psychiatric: She has a normal mood and affect.      Assessment & Plan:   Teona was seen today for ankle pain and knee pain.  Diagnoses and all orders for this visit:  Fall, initial encounter -     DG Ankle Complete Left; Future -     DG Knee 1-2 Views Right; Future -     DG Foot Complete Right; Future  Contusion, knee and lower leg, right, initial encounter  Contusion of ankle, initial encounter  Other orders -     diclofenac (VOLTAREN) 75 MG EC tablet; Take 1 tablet (75 mg total) by mouth 2 (  two) times daily with a meal.   Rest, ice, elevate, . Brace when active.    I am having China E. Kilgore start on diclofenac. I am also having her maintain her multivitamin, estradiol, ALPRAZolam, cyclobenzaprine, and predniSONE.  Allergies as of 08/05/2017   No Known Allergies     Medication List        Accurate as of 08/05/17 11:59 PM. Always use your most recent med list.          ALPRAZolam 0.25 MG tablet Commonly known as:  XANAX TAKE ONE TABLET AT BEDTIME AS NEEDED   cyclobenzaprine 5 MG  tablet Commonly known as:  FLEXERIL Take 1 tablet (5 mg total) by mouth 3 (three) times daily as needed for muscle spasms.   diclofenac 75 MG EC tablet Commonly known as:  VOLTAREN Take 1 tablet (75 mg total) by mouth 2 (two) times daily with a meal.   estradiol 0.1 MG/24HR patch Commonly known as:  VIVELLE-DOT Place 1 patch (0.1 mg total) onto the skin 2 (two) times a week.   multivitamin capsule Take 1 capsule by mouth daily.   predniSONE 10 MG (21) Tbpk tablet Commonly known as:  STERAPRED UNI-PAK 21 TAB As directed x 6 days        Follow-up: Return if symptoms worsen or fail to improve.  Claretta Fraise, M.D.

## 2017-08-05 NOTE — Patient Instructions (Signed)
Rest, Ice, and elevate

## 2017-08-10 ENCOUNTER — Encounter: Payer: Self-pay | Admitting: Family Medicine

## 2017-08-12 DIAGNOSIS — M5126 Other intervertebral disc displacement, lumbar region: Secondary | ICD-10-CM | POA: Diagnosis not present

## 2017-08-12 DIAGNOSIS — M545 Low back pain: Secondary | ICD-10-CM | POA: Diagnosis not present

## 2017-08-12 DIAGNOSIS — M5380 Other specified dorsopathies, site unspecified: Secondary | ICD-10-CM | POA: Diagnosis not present

## 2017-08-12 DIAGNOSIS — M5137 Other intervertebral disc degeneration, lumbosacral region: Secondary | ICD-10-CM | POA: Diagnosis not present

## 2017-08-18 ENCOUNTER — Encounter: Payer: BLUE CROSS/BLUE SHIELD | Admitting: Gynecology

## 2017-08-18 ENCOUNTER — Ambulatory Visit
Admission: RE | Admit: 2017-08-18 | Discharge: 2017-08-18 | Disposition: A | Discharge status: Home / Self Care | Payer: BLUE CROSS/BLUE SHIELD | Source: Ambulatory Visit | Attending: Gynecology | Admitting: Gynecology

## 2017-08-18 DIAGNOSIS — Z1231 Encounter for screening mammogram for malignant neoplasm of breast: Secondary | ICD-10-CM

## 2017-08-19 ENCOUNTER — Other Ambulatory Visit: Payer: Self-pay | Admitting: Gynecology

## 2017-08-19 DIAGNOSIS — R928 Other abnormal and inconclusive findings on diagnostic imaging of breast: Secondary | ICD-10-CM

## 2017-08-25 ENCOUNTER — Encounter: Payer: Self-pay | Admitting: Gynecology

## 2017-08-25 ENCOUNTER — Ambulatory Visit: Payer: BLUE CROSS/BLUE SHIELD | Admitting: Gynecology

## 2017-08-25 ENCOUNTER — Encounter: Payer: BLUE CROSS/BLUE SHIELD | Admitting: Gynecology

## 2017-08-25 VITALS — BP 120/78 | Ht 68.0 in | Wt 137.0 lb

## 2017-08-25 DIAGNOSIS — N952 Postmenopausal atrophic vaginitis: Secondary | ICD-10-CM

## 2017-08-25 DIAGNOSIS — N951 Menopausal and female climacteric states: Secondary | ICD-10-CM | POA: Diagnosis not present

## 2017-08-25 DIAGNOSIS — Z13220 Encounter for screening for lipoid disorders: Secondary | ICD-10-CM | POA: Diagnosis not present

## 2017-08-25 DIAGNOSIS — Z01411 Encounter for gynecological examination (general) (routine) with abnormal findings: Secondary | ICD-10-CM

## 2017-08-25 DIAGNOSIS — M5416 Radiculopathy, lumbar region: Secondary | ICD-10-CM | POA: Diagnosis not present

## 2017-08-25 DIAGNOSIS — E559 Vitamin D deficiency, unspecified: Secondary | ICD-10-CM

## 2017-08-25 MED ORDER — ALPRAZOLAM 0.25 MG PO TABS: ORAL_TABLET | ORAL | 2 refills | Status: DC

## 2017-08-25 NOTE — Progress Notes (Signed)
    Julie Cantrell 12-24-1965 [id number]        51 y.o.  G1P1001 for annual gynecologic exam.  Having mild menopausal symptoms to include some night sweats and occasional hot flushes.  We discussed HRT last year and she was to initiate but decided against this.  Past medical history,surgical history, problem list, medications, allergies, family history and social history were all reviewed and documented as reviewed in the EPIC chart.  ROS:  Performed with pertinent positives and negatives included in the history, assessment and plan.   Additional significant findings : None   Exam: [name] assistant Vitals:   08/25/17 1428  BP: 120/78  Weight: 137 lb (62.1 kg)  Height: 5\' 8"  (1.727 m)   Body mass index is 20.83 kg/m.  General appearance:  Normal affect, orientation and appearance. Skin: Grossly normal HEENT: Without gross lesions.  No cervical or supraclavicular adenopathy. Thyroid normal.  Lungs:  Clear without wheezing, rales or rhonchi Cardiac: RR, without RMG Abdominal:  Soft, nontender, without masses, guarding, rebound, organomegaly or hernia Breasts:  Examined lying and sitting without masses, retractions, discharge or axillary adenopathy. Pelvic:  Ext, BUS, Vagina: Normal with mild atrophic changes  Adnexa: Without masses or tenderness    Anus and perineum: Normal   Rectovaginal: Normal sphincter tone without palpated masses or tenderness.    Assessment/Plan:  52 y.o. G10P1001 female for annual gynecologic exam status post [place] 2012 for menorrhagia/adenomyosis.   1. Menopausal symptoms/mild atrophic changes.  Patient decided against HRT.  I discussed OTC options she is going to try some soy based products to see if that does not help.  She will follow-up if she wants to rediscuss HRT. 2. Mammography this month.  Continue with annual mammography next year.  Breast exam normal today. 3. Colonoscopy never.  She is going to arrange to have it done this year.  Names and  numbers provided. 4. Pap smear 2015.  Pap smear of vaginal cuff done today.  History of LGSIL and ASCUS 2000 01/2006.  Options to stop screening per current screening guidelines reviewed based on hysterectomy history.  At this point the patient prefers to continue with screening. 5. DEXA never.  Will plan further into the menopause. 6. Anxiety.  Uses Xanax 0.25 mg occasionally.  No side effects/sedation.  Xanax 0.25 mg #30 with 2 refills provided. 7. Health maintenance.  Requests baseline labs.  CBC, CMP, lipid profile, TSH and vitamin D ordered.  Does have a history of vitamin D deficiency in the past.  Follow-up in 1 year, sooner as needed.   [name] MD, 3:10 PM 08/25/2017

## 2017-08-25 NOTE — Addendum Note (Signed)
Addended by: Nelva Nay on: 08/25/2017 03:41 PM   Modules accepted: Orders

## 2017-08-25 NOTE — Patient Instructions (Signed)
Schedule your colonoscopy with either:  [facility]   Address: [address], [place], [place] [address]  [phone]    or  [facility]  Address: [address], [place], [place] [address]  [phone]      

## 2017-08-26 ENCOUNTER — Other Ambulatory Visit: Payer: Self-pay | Admitting: Gynecology

## 2017-08-26 ENCOUNTER — Ambulatory Visit
Admission: RE | Admit: 2017-08-26 | Discharge: 2017-08-26 | Disposition: A | Discharge status: Home / Self Care | Payer: BLUE CROSS/BLUE SHIELD | Source: Ambulatory Visit | Attending: Gynecology | Admitting: Gynecology

## 2017-08-26 DIAGNOSIS — R921 Mammographic calcification found on diagnostic imaging of breast: Secondary | ICD-10-CM | POA: Diagnosis not present

## 2017-08-26 DIAGNOSIS — E7800 Pure hypercholesterolemia, unspecified: Secondary | ICD-10-CM

## 2017-08-26 DIAGNOSIS — R928 Other abnormal and inconclusive findings on diagnostic imaging of breast: Secondary | ICD-10-CM

## 2017-08-26 LAB — CBC WITH DIFFERENTIAL/PLATELET
BASOS ABS: 28 {cells}/uL (ref 0–200)
Basophils Relative: 0.6 %
EOS PCT: 2.8 %
Eosinophils Absolute: 129 cells/uL (ref 15–500)
HCT: 35 % (ref 35.0–45.0)
Hemoglobin: 12 g/dL (ref 11.7–15.5)
Lymphs Abs: 1748 cells/uL (ref 850–3900)
MCH: 30.5 pg (ref 27.0–33.0)
MCHC: 34.3 g/dL (ref 32.0–36.0)
MCV: 88.8 fL (ref 80.0–100.0)
MPV: 10 fL (ref 7.5–12.5)
Monocytes Relative: 6.9 %
NEUTROS PCT: 51.7 %
Neutro Abs: 2378 cells/uL (ref 1500–7800)
Platelets: 199 10*3/uL (ref 140–400)
RBC: 3.94 10*6/uL (ref 3.80–5.10)
RDW: 11.5 % (ref 11.0–15.0)
Total Lymphocyte: 38 %
WBC mixed population: 317 cells/uL (ref 200–950)
WBC: 4.6 10*3/uL (ref 3.8–10.8)

## 2017-08-26 LAB — COMPREHENSIVE METABOLIC PANEL
AG Ratio: 2 (calc) (ref 1.0–2.5)
ALT: 12 U/L (ref 6–29)
AST: 17 U/L (ref 10–35)
Albumin: 4.3 g/dL (ref 3.6–5.1)
Alkaline phosphatase (APISO): 73 U/L (ref 33–130)
BILIRUBIN TOTAL: 0.4 mg/dL (ref 0.2–1.2)
BUN: 14 mg/dL (ref 7–25)
CALCIUM: 9.3 mg/dL (ref 8.6–10.4)
CO2: 25 mmol/L (ref 20–32)
Chloride: 106 mmol/L (ref 98–110)
Creat: 0.76 mg/dL (ref 0.50–1.05)
GLUCOSE: 81 mg/dL (ref 65–99)
Globulin: 2.2 g/dL (calc) (ref 1.9–3.7)
Potassium: 4.1 mmol/L (ref 3.5–5.3)
SODIUM: 139 mmol/L (ref 135–146)
TOTAL PROTEIN: 6.5 g/dL (ref 6.1–8.1)

## 2017-08-26 LAB — LIPID PANEL
Cholesterol: 206 mg/dL — ABNORMAL HIGH (ref <200)
HDL: 58 mg/dL (ref >50)
LDL CHOLESTEROL (CALC): 121 mg/dL — AB
Non-HDL Cholesterol (Calc): 148 mg/dL (calc) — ABNORMAL HIGH (ref <130)
TRIGLYCERIDES: 153 mg/dL — AB (ref <150)
Total CHOL/HDL Ratio: 3.6 (calc) (ref <5.0)

## 2017-08-26 LAB — VITAMIN D 25 HYDROXY (VIT D DEFICIENCY, FRACTURES): Vit D, 25-Hydroxy: 41 ng/mL (ref 30–100)

## 2017-08-26 LAB — TSH: TSH: 2.26 m[IU]/L

## 2017-08-27 LAB — PAP IG W/ RFLX HPV ASCU

## 2017-09-01 ENCOUNTER — Ambulatory Visit
Admission: RE | Admit: 2017-09-01 | Discharge: 2017-09-01 | Disposition: A | Discharge status: Home / Self Care | Payer: BLUE CROSS/BLUE SHIELD | Source: Ambulatory Visit | Attending: Gynecology | Admitting: Gynecology

## 2017-09-01 ENCOUNTER — Other Ambulatory Visit: Payer: Self-pay | Admitting: Gynecology

## 2017-09-01 DIAGNOSIS — R921 Mammographic calcification found on diagnostic imaging of breast: Secondary | ICD-10-CM

## 2017-09-01 DIAGNOSIS — N6012 Diffuse cystic mastopathy of left breast: Secondary | ICD-10-CM | POA: Diagnosis not present

## 2017-09-02 DIAGNOSIS — M5136 Other intervertebral disc degeneration, lumbar region: Secondary | ICD-10-CM | POA: Diagnosis not present

## 2017-09-02 DIAGNOSIS — M5442 Lumbago with sciatica, left side: Secondary | ICD-10-CM | POA: Diagnosis not present

## 2017-09-07 ENCOUNTER — Other Ambulatory Visit: Payer: Self-pay

## 2017-09-07 ENCOUNTER — Ambulatory Visit: Payer: BLUE CROSS/BLUE SHIELD | Attending: Specialist | Admitting: Physical Therapy

## 2017-09-07 DIAGNOSIS — M5442 Lumbago with sciatica, left side: Secondary | ICD-10-CM

## 2017-09-07 DIAGNOSIS — M6281 Muscle weakness (generalized): Secondary | ICD-10-CM

## 2017-09-07 NOTE — Therapy (Signed)
[facility] [place] [place], [place], [address] Phone: [phone]   Fax:  [phone]  Physical Therapy Evaluation  Patient Details  Name: Julie Cantrell MRN: [id number] Date of Birth: 04-13-1966 Referring Provider: Susa Day, MD   Encounter Date: 09/07/2017  PT End of Session - 09/07/17 1525    Visit Number  1    Number of Visits  12    Date for PT Re-Evaluation  10/19/17    PT Start Time  1430    PT Stop Time  1525    PT Time Calculation (min)  55 min    Activity Tolerance  Patient tolerated treatment well;No increased pain    Behavior During Therapy  WFL for tasks assessed/performed       Past Medical History:  Diagnosis Date  . Abdominal adhesions   . Anxiety   . ASCUS (atypical squamous cells of undetermined significance) on Pap smear 11/2005   NORMAL PAPS 11/07 AND 4/08  . 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 Pulse irregularity 10-2010    Past Surgical History:  Procedure Laterality Date  . KIDNEY SURGERY  1985  . LAPAROSCOPIC TOTAL HYSTERECTOMY  08/2010   menorrhagia/adenomyosis  . NASAL SINUS SURGERY  1995    There were no vitals filed for this visit.   Subjective Assessment - 09/07/17 1504    Subjective  Patient presents to physical therapy with complaints of low back pain and occasional "tingling" down the left leg. Patient stated it began after cleaning out the basement at the beginning of Fall 2018. Patient stated the pain has not really left, just lessened. Patient stated pain is always there but does not affect her from performing daily activities. Patient states her back becomes sore and irritable after bending, lifting, performing yard and daily house work. Patient has been more cognizant of using proper body mechanics in which patient states has improved her back pain. Pain is currently at 2/10 in low back with no radiating symptoms down left LE.    Limitations  House hold  activities    How long can you sit comfortably?  not limited    How long can you stand comfortably?  not limited    How long can you walk comfortably?  not limited    Diagnostic tests  MRI & X-Ray    Patient Stated Goals  Decrease pain, improve strength    Currently in Pain?  Yes    Pain Score  2     Pain Location  Back    Pain Orientation  Lower;Left    Pain Descriptors / Indicators  Dull;Sore;Aching    Pain Type  Chronic pain    Pain Onset  More than a month ago    Pain Frequency  Intermittent    Aggravating Factors   Standing, laying    Pain Relieving Factors  bending, lifting         [facility] PT Assessment - 09/07/17 0001      Assessment   Medical Diagnosis  Lumbago with left sciatica    Referring Provider  Susa Day, MD    Onset Date/Surgical Date  05/08/17 onset    Next MD Visit  --    Prior Therapy  n/a      Precautions   Precautions  None      Balance Screen   Has the patient fallen in the past 6 months  Yes    How many times?  1  Has the patient had a decrease in activity level because of a fear of falling?   No    Is the patient reluctant to leave their home because of a fear of falling?   No      Home Environment   Living Environment  Private residence    Living Arrangements  Spouse/significant other    Type of [place] to enter    Entrance Stairs-Number of Steps  1    Entrance Stairs-Rails  None      Prior Function   Level of Independence  Independent    Vocation  Full time employment      Posture/Postural Control   Posture/Postural Control  No significant limitations      ROM / Strength   AROM / PROM / Strength  Strength;AROM      AROM   Overall AROM   Within functional limits for tasks performed    Overall AROM Comments  no pain with any motion    AROM Assessment Site  Lumbar    Lumbar Flexion  4" finger tip to floor      Strength   Overall Strength  Within functional limits for tasks performed    Strength  Assessment Site  Hip;Lumbar;Knee    Right/Left Hip  Left    Left Hip Flexion  4+/5    Left Hip Extension  4+/5    Left Hip External Rotation  4+/5    Left Hip Internal Rotation  4+/5    Left Hip ABduction  4/5    Right/Left Knee  Left    Left Knee Flexion  4/5    Left Knee Extension  5/5    Lumbar Flexion  2/5 2/5, able to clear head and scapula only      Flexibility   Soft Tissue Assessment /Muscle Length  yes    Hamstrings  WFL      Palpation   SI assessment   equal leg lengths, ASIS and PSIS    Palpation comment  tender to palpation left QL      Special Tests    Special Tests  Lumbar    Other special tests  2+ LE reflexes    Lumbar Tests  FABER test;Straight Leg Raise      FABER test   findings  Negative    Side  LEft    Comment  decreased flexibility on left, no increase of pain      Straight Leg Raise   Findings  Negative    Side   Left    Comment  no neurological sx reproduced      Transfers   Transfers  Independent with all Transfers      Ambulation/Gait   Ambulation/Gait  Yes    Ambulation/Gait Assistance  7: Independent    Gait Pattern  Within Functional Limits      Functional Gait  Assessment   Gait assessed   --             Objective measurements completed on examination: See above findings.      [facility] Adult PT Treatment/Exercise - 09/07/17 0001      Exercises   Exercises  Lumbar      Lumbar Exercises: Stretches   Piriformis Stretch  3 reps;30 seconds;Right;Left      Lumbar Exercises: Supine   Ab Set  20 reps;2 seconds      Lumbar Exercises: Prone   Other Prone Lumbar Exercises  Prone on elbow Press ups x15              PT Education - 09/07/17 1547    Education provided  Yes    Education Details  Prone press up on elbows, draw ins, piriformis stretch    Person(s) Educated  Patient    Methods  Explanation;Demonstration;Handout    Comprehension  Verbalized understanding;Returned demonstration       PT Short Term Goals -  09/07/17 1631      PT SHORT TERM GOAL #1   Title  Patient will be independent with HEP.    Time  2    Period  Weeks    Status  New    Target Date  09/21/17      PT SHORT TERM GOAL #2   Title  Patient will decrease pain to less than or equal to 1/10 while performing ADLs.    Time  2    Period  Weeks    Status  New    Target Date  09/21/17        PT Long Term Goals - 09/07/17 1632      PT LONG TERM GOAL #1   Title  Patient self report no neurological symptoms with bending, lifting or functional activities.    Time  6    Period  Weeks    Status  New    Target Date  10/19/17      PT LONG TERM GOAL #2   Title  Patient will improve left hip strength to 5/5 to improve ADLs and functional activities.    Time  6    Period  Weeks    Status  New    Target Date  10/19/17      PT LONG TERM GOAL #3   Title  Patient will improve abdominal strength to 4/5 (come to sitting position arms across chest) improve overall core and spinal stability.    Time  6    Period  Weeks    Status  New    Target Date  10/19/17      PT LONG TERM GOAL #4   Title  Patient will demonstrate proper body and lifting mechanics with no cuing in order to protect back during lifting and changes of positioning.    Time  6    Period  Weeks    Status  New      PT LONG TERM GOAL #5   Title  Patient will report 0/10 pain with all functional activities an ADLs.    Time  6    Period  Weeks    Status  New    Target Date  10/19/17             Plan - 09/07/17 1701    Clinical Impression Statement  Patient is a 52 year old female who presents to physical therapy with low back pain and occasional left LE tingling and that affects her ability to complete household activities. Patient has decreased left LE strength. Patient's radiating symptoms were unable to be replicated with left straight leg raise test. Tender palpation at left QL noted. Patient demonstrated proper bed mobility with sidelying to sit and  rolling and proper transfer techniques. Due to time constraints, patient is only able to come 1x per week. Patient will try to attend 2x per week if schedule allows. Patient and PT discussed the importance of compliancy of HEP and therapy visits to achieve goals. PT also discussed plan of care of  overall strengthening of LE, core stabilization, flexibility and modalities as needed. Patient reported understanding and agreement.    Clinical Presentation  Stable    Clinical Decision Making  Low    Rehab Potential  Excellent    PT Frequency  2x / week    PT Duration  6 weeks    PT Treatment/Interventions  ADLs/Self Care Home Management;Electrical Stimulation;Ultrasound;Traction;Moist Heat;Iontophoresis 4mg /ml Dexamethasone;Stair training;Functional mobility training;Therapeutic activities;Therapeutic exercise;Patient/family education;Neuromuscular re-education;Balance training;Manual techniques;Dry needling;Passive range of motion    PT Next Visit Plan  Begin warm up with Nustep, core stabilization and hip strengthening    PT Home Exercise Plan  Draw ins, Piriformis stretch, prone press ups on elbows    Consulted and Agree with Plan of Care  Patient       Patient will benefit from skilled therapeutic intervention in order to improve the following deficits and impairments:  Pain, Decreased activity tolerance, Decreased strength  Visit Diagnosis: Bilateral low back pain with left-sided sciatica, unspecified chronicity  Muscle weakness (generalized)     Problem List Patient Active Problem List   Diagnosis Date Noted  . Anxiety   . PVC's (premature ventricular contractions) 12/03/2010   Gabriela Eves, PT, DPT 09/07/2017, 5:06 PM  [facility] [address] [place], [place], [address] Phone: [phone]   Fax:  [phone]  Name: JMYA ULIANO MRN: [id number] Date of Birth: [birth year]

## 2017-09-07 NOTE — Patient Instructions (Signed)
  Prone press ups on elbows

## 2017-09-09 ENCOUNTER — Other Ambulatory Visit: Payer: Self-pay | Admitting: Gynecology

## 2017-09-09 DIAGNOSIS — E7800 Pure hypercholesterolemia, unspecified: Secondary | ICD-10-CM

## 2017-09-15 ENCOUNTER — Ambulatory Visit: Payer: BLUE CROSS/BLUE SHIELD | Attending: Specialist | Admitting: Physical Therapy

## 2017-09-15 DIAGNOSIS — M5442 Lumbago with sciatica, left side: Secondary | ICD-10-CM | POA: Diagnosis not present

## 2017-09-15 DIAGNOSIS — M6281 Muscle weakness (generalized): Secondary | ICD-10-CM | POA: Insufficient documentation

## 2017-09-15 NOTE — Therapy (Signed)
[facility] [place], [place], [address] Phone: [phone]   Fax:  [phone]  Physical Therapy Treatment  Patient Details  Name: Julie Cantrell MRN: [id number] Date of Birth: [birth year] Referring Provider: Susa Day, MD   Encounter Date: 09/15/2017  PT End of Session - 09/15/17 1654    Visit Number  2    Number of Visits  12    Date for PT Re-Evaluation  10/19/17    PT Start Time  7412    PT Stop Time  1742    PT Time Calculation (min)  47 min    Activity Tolerance  Patient tolerated treatment well;No increased pain    Behavior During Therapy  WFL for tasks assessed/performed       Past Medical History:  Diagnosis Date  . Abdominal adhesions   . Anxiety   . ASCUS (atypical squamous cells of undetermined significance) on Pap smear 11/2005   NORMAL PAPS 11/07 AND 4/08  . 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 Pulse irregularity 10-2010    Past Surgical History:  Procedure Laterality Date  . KIDNEY SURGERY  1985  . LAPAROSCOPIC TOTAL HYSTERECTOMY  08/2010   menorrhagia/adenomyosis  . NASAL SINUS SURGERY  1995    There were no vitals filed for this visit.  Subjective Assessment - 09/15/17 1659    Subjective  Patient states overall shes feels "fine." She noticed muscle soreness in back especially after performing HEP but nothing untolerable.    Limitations  House hold activities    Patient Stated Goals  Decrease pain, improve strength    Currently in Pain?  Yes    Pain Score  2     Pain Location  Back    Pain Orientation  Lower;Left    Pain Descriptors / Indicators  Aching;Sore    Pain Type  Chronic pain    Pain Onset  More than a month ago                      [facility] Adult PT Treatment/Exercise - 09/15/17 0001      Exercises   Exercises  Lumbar      Lumbar Exercises: Aerobic   Nustep  Seat 8, Level 3 x10 minutes with emphasis on draw in      Lumbar  Exercises: Standing   Row  Strengthening;Both;20 reps red theraband    Shoulder Extension  Strengthening;Both;20 reps red theraband      Lumbar Exercises: Supine   Bent Knee Raise  --    Bridge  20 reps;2 seconds    Other Supine Lumbar Exercises  supine 90 hip/90 knee heel taps with emphasis on draw in and back flat on plinth x2 minutes      Lumbar Exercises: Sidelying   Clam  Both;20 reps      Modalities   Modalities  Moist Heat;Electrical Stimulation      Moist Heat Therapy   Number Minutes Moist Heat  10 Minutes    Moist Heat Location  Lumbar Spine      Electrical Stimulation   Electrical Stimulation Location  Bilateral lumbar paraspinals    Electrical Stimulation Action  Pre-Mod    Electrical Stimulation Parameters  80-150 Hz x10 minutes    Electrical Stimulation Goals  Pain             PT Education - 09/15/17 1737    Education provided  Yes    Education Details  SEE Patient  instructions    Person(s) Educated  Patient    Methods  Explanation;Demonstration;Handout    Comprehension  Verbalized understanding;Returned demonstration       PT Short Term Goals - 09/07/17 1631      PT SHORT TERM GOAL #1   Title  Patient will be independent with HEP.    Time  2    Period  Weeks    Status  New    Target Date  09/21/17      PT SHORT TERM GOAL #2   Title  Patient will decrease pain to less than or equal to 1/10 while performing ADLs.    Time  2    Period  Weeks    Status  New    Target Date  09/21/17        PT Long Term Goals - 09/07/17 1632      PT LONG TERM GOAL #1   Title  Patient self report no neurological symptoms with bending, lifting or functional activities.    Time  6    Period  Weeks    Status  New    Target Date  10/19/17      PT LONG TERM GOAL #2   Title  Patient will improve left hip strength to 5/5 to improve ADLs and functional activities.    Time  6    Period  Weeks    Status  New    Target Date  10/19/17      PT LONG TERM GOAL #3    Title  Patient will improve abdominal strength to 4/5 (come to sitting position arms across chest) improve overall core and spinal stability.    Time  6    Period  Weeks    Status  New    Target Date  10/19/17      PT LONG TERM GOAL #4   Title  Patient will demonstrate proper body and lifting mechanics with no cuing in order to protect back during lifting and changes of positioning.    Time  6    Period  Weeks    Status  New      PT LONG TERM GOAL #5   Title  Patient will report 0/10 pain with all functional activities an ADLs.    Time  6    Period  Weeks    Status  New    Target Date  10/19/17            Plan - 09/15/17 1709    Clinical Impression Statement  Patient was able to complete exercises with great form and technique. Minimal VC to prevent rolling back during clam shell exercise but patient was able to perform with proper form for the remainder of the exercise. Patient had no adverse affects to e-stim and moist heat. Due to scheduling conflicts, patient is only able to attend PT 1x/week. Patient provided HEP of today's exercises and instructed to perform every other day, alternating with arerobic bike/elliptical exercise. Patient also provided with information on portable TENS unit. Patient reported understanding.    Rehab Potential  Excellent    PT Frequency  2x / week    PT Duration  6 weeks    PT Treatment/Interventions  ADLs/Self Care Home Management;Electrical Stimulation;Ultrasound;Traction;Moist Heat;Iontophoresis 4mg /ml Dexamethasone;Stair training;Functional mobility training;Therapeutic activities;Therapeutic exercise;Patient/family education;Neuromuscular re-education;Balance training;Manual techniques;Dry needling;Passive range of motion    PT Next Visit Plan  Progress core stabilization as tolerated. Assess body mechanics with lifting    PT Home Exercise  Plan  Draw ins, Piriformis stretch, prone press ups on elbows, bridging, 90/90 heel taps, clams, rows and  extension    Consulted and Agree with Plan of Care  Patient       Patient will benefit from skilled therapeutic intervention in order to improve the following deficits and impairments:  Pain, Decreased activity tolerance, Decreased strength  Visit Diagnosis: Bilateral low back pain with left-sided sciatica, unspecified chronicity  Muscle weakness (generalized)     Problem List Patient Active Problem List   Diagnosis Date Noted  . Anxiety   . PVC's (premature ventricular contractions) 12/03/2010   Gabriela Eves, PT, DPT 09/15/2017, 6:00 PM  [facility] [address] [place], [place], [address] Phone: [phone]   Fax:  [phone]  Name: Julie Cantrell MRN: [id number] Date of Birth: February 15, 1966

## 2017-09-15 NOTE — Patient Instructions (Signed)
   Correne Lalani, PT, DPT [facility] [address] [place], [place], [address] Phone: [phone]   Fax:  [phone]  

## 2017-09-24 ENCOUNTER — Ambulatory Visit: Payer: BLUE CROSS/BLUE SHIELD | Admitting: Physical Therapy

## 2017-09-24 DIAGNOSIS — M5442 Lumbago with sciatica, left side: Secondary | ICD-10-CM

## 2017-09-24 DIAGNOSIS — M6281 Muscle weakness (generalized): Secondary | ICD-10-CM

## 2017-09-24 NOTE — Therapy (Signed)
[facility] [place], [place], [address] Phone: [phone]   Fax:  [phone]  Physical Therapy Treatment  Patient Details  Name: Julie Cantrell MRN: [id number] Date of Birth: 1966/06/28 Referring Provider: Susa Day, MD   Encounter Date: 09/24/2017  PT End of Session - 09/24/17 1538    Visit Number  3    Number of Visits  12    Date for PT Re-Evaluation  10/19/17    PT Start Time  8502    PT Stop Time  1609    PT Time Calculation (min)  46 min    Activity Tolerance  Patient tolerated treatment well;No increased pain    Behavior During Therapy  WFL for tasks assessed/performed       Past Medical History:  Diagnosis Date  . Abdominal adhesions   . Anxiety   . ASCUS (atypical squamous cells of undetermined significance) on Pap smear 11/2005   NORMAL PAPS 11/07 AND 4/08  . 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 Pulse irregularity 10-2010    Past Surgical History:  Procedure Laterality Date  . KIDNEY SURGERY  1985  . LAPAROSCOPIC TOTAL HYSTERECTOMY  08/2010   menorrhagia/adenomyosis  . NASAL SINUS SURGERY  1995    There were no vitals filed for this visit.  Subjective Assessment - 09/24/17 1806    Subjective  Patient reported feeling sore from sitting in hard chairs at the hospital for hours last night. Back feels better; still feeling sore after she performs exercises but its not painful. Patient no longer reports numbness or tingling radiating down the leg.    Limitations  House hold activities    How long can you sit comfortably?  not limited    How long can you stand comfortably?  not limited    How long can you walk comfortably?  not limited    Diagnostic tests  MRI & X-Ray    Patient Stated Goals  Decrease pain, improve strength    Currently in Pain?  Yes    Pain Score  1     Pain Location  Back    Pain Orientation  Lower;Left    Pain Descriptors / Indicators  Sore    Pain Type  Chronic pain    Pain Onset  More than a month ago    Pain Frequency  Intermittent                      [facility] Adult PT Treatment/Exercise - 09/24/17 0001      Exercises   Exercises  Lumbar      Lumbar Exercises: Aerobic   Nustep  Seat 8, Level 4 x10 minutes with emphasis on draw in      Lumbar Exercises: Supine   Bridge with March  20 reps;2 seconds emphasis on draw in      Lumbar Exercises: Sidelying   Hip Abduction  Both;Other (comment) 30x      Lumbar Exercises: Prone   Opposite Arm/Leg Raise  Right arm/Left leg;Left arm/Right leg;20 reps;2 seconds      Modalities   Modalities  Moist Heat;Electrical Stimulation      Moist Heat Therapy   Number Minutes Moist Heat  10 Minutes    Moist Heat Location  Lumbar Spine      Electrical Stimulation   Electrical Stimulation Location  Left low back    Electrical Stimulation Action  Pre-mod    Electrical Stimulation Parameters  80-150 Hz  x10    Electrical Stimulation Goals  Pain             PT Education - 09/24/17 1806    Education provided  Yes    Education Details  see patient instructions    Person(s) Educated  Patient    Methods  Demonstration;Explanation;Handout    Comprehension  Returned demonstration;Verbalized understanding       PT Short Term Goals - 09/07/17 1631      PT SHORT TERM GOAL #1   Title  Patient will be independent with HEP.    Time  2    Period  Weeks    Status  New    Target Date  09/21/17      PT SHORT TERM GOAL #2   Title  Patient will decrease pain to less than or equal to 1/10 while performing ADLs.    Time  2    Period  Weeks    Status  New    Target Date  09/21/17        PT Long Term Goals - 09/07/17 1632      PT LONG TERM GOAL #1   Title  Patient self report no neurological symptoms with bending, lifting or functional activities.    Time  6    Period  Weeks    Status  New    Target Date  10/19/17      PT LONG TERM GOAL #2   Title  Patient will  improve left hip strength to 5/5 to improve ADLs and functional activities.    Time  6    Period  Weeks    Status  New    Target Date  10/19/17      PT LONG TERM GOAL #3   Title  Patient will improve abdominal strength to 4/5 (come to sitting position arms across chest) improve overall core and spinal stability.    Time  6    Period  Weeks    Status  New    Target Date  10/19/17      PT LONG TERM GOAL #4   Title  Patient will demonstrate proper body and lifting mechanics with no cuing in order to protect back during lifting and changes of positioning.    Time  6    Period  Weeks    Status  New      PT LONG TERM GOAL #5   Title  Patient will report 0/10 pain with all functional activities an ADLs.    Time  6    Period  Weeks    Status  New    Target Date  10/19/17            Plan - 09/24/17 1812    Clinical Impression Statement  Patient was able to complete exercises with no increase of pain. Patient reported muscle fatigue but was able to complete exercises with some rests. Patient provided with HEP consisting of today's exercises and education on proper body mechanics for ADLs. Patient reported understanding. No adverse affects noted upon removal of modalities.    Clinical Presentation  Stable    Clinical Decision Making  Low    Rehab Potential  Excellent    PT Frequency  2x / week    PT Duration  6 weeks    PT Treatment/Interventions  ADLs/Self Care Home Management;Electrical Stimulation;Ultrasound;Traction;Moist Heat;Iontophoresis 4mg /ml Dexamethasone;Stair training;Functional mobility training;Therapeutic activities;Therapeutic exercise;Patient/family education;Neuromuscular re-education;Balance training;Manual techniques;Dry needling;Passive range of motion    PT Next  Visit Plan  Progress core stabilization as tolerated. Assess body mechanics with lifting    Consulted and Agree with Plan of Care  Patient       Patient will benefit from skilled therapeutic  intervention in order to improve the following deficits and impairments:  Pain, Decreased activity tolerance, Decreased strength  Visit Diagnosis: Bilateral low back pain with left-sided sciatica, unspecified chronicity  Muscle weakness (generalized)     Problem List Patient Active Problem List   Diagnosis Date Noted  . Anxiety   . PVC's (premature ventricular contractions) 12/03/2010    Gabriela Eves, PT, DPT 09/24/2017, 6:15 PM  [facility] [address] [place], [place], [address] Phone: [phone]   Fax:  [phone]  Name: Julie Cantrell MRN: [id number] Date of Birth: [birth year]

## 2017-09-24 NOTE — Patient Instructions (Signed)
   Brier Reid, PT, DPT [facility] [address] [place], [place], [address] Phone: [phone]   Fax:  [phone]  

## 2017-09-28 ENCOUNTER — Other Ambulatory Visit: Payer: BLUE CROSS/BLUE SHIELD

## 2017-09-28 DIAGNOSIS — E7800 Pure hypercholesterolemia, unspecified: Secondary | ICD-10-CM | POA: Diagnosis not present

## 2017-09-28 DIAGNOSIS — N131 Hydronephrosis with ureteral stricture, not elsewhere classified: Secondary | ICD-10-CM | POA: Diagnosis not present

## 2017-09-28 LAB — LIPID PANEL
Cholesterol: 208 mg/dL — ABNORMAL HIGH (ref <200)
HDL: 59 mg/dL (ref >50)
LDL CHOLESTEROL (CALC): 130 mg/dL — AB
Non-HDL Cholesterol (Calc): 149 mg/dL (calc) — ABNORMAL HIGH (ref <130)
Total CHOL/HDL Ratio: 3.5 (calc) (ref <5.0)
Triglycerides: 93 mg/dL (ref <150)

## 2017-09-29 ENCOUNTER — Encounter: Payer: Self-pay | Admitting: *Deleted

## 2017-10-01 ENCOUNTER — Encounter: Payer: Self-pay | Admitting: Physical Therapy

## 2017-10-01 ENCOUNTER — Ambulatory Visit: Payer: BLUE CROSS/BLUE SHIELD | Admitting: Physical Therapy

## 2017-10-01 DIAGNOSIS — M5442 Lumbago with sciatica, left side: Secondary | ICD-10-CM

## 2017-10-01 DIAGNOSIS — M6281 Muscle weakness (generalized): Secondary | ICD-10-CM

## 2017-10-01 NOTE — Therapy (Signed)
[facility] [place], [place], [address] Phone: [phone]   Fax:  [phone]  Physical Therapy Treatment  Patient Details  Name: Julie Cantrell MRN: [id number] Date of Birth: 1965/09/21 Referring Provider: Susa Day, MD   Encounter Date: 10/01/2017  PT End of Session - 10/01/17 1654    Visit Number  4    Number of Visits  12    Date for PT Re-Evaluation  10/19/17    PT Start Time  0626    PT Stop Time  1747    PT Time Calculation (min)  64 min    Activity Tolerance  Patient tolerated treatment well    Behavior During Therapy  [facility] for tasks assessed/performed       Past Medical History:  Diagnosis Date  . Abdominal adhesions   . Anxiety   . ASCUS (atypical squamous cells of undetermined significance) on Pap smear 11/2005   NORMAL PAPS 11/07 AND 4/08  . 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 Pulse irregularity 10-2010    Past Surgical History:  Procedure Laterality Date  . KIDNEY SURGERY  1985  . LAPAROSCOPIC TOTAL HYSTERECTOMY  08/2010   menorrhagia/adenomyosis  . NASAL SINUS SURGERY  1995    There were no vitals filed for this visit.  Subjective Assessment - 10/01/17 1652    Subjective  Reports that her low back is more sore from exercises at home but no pain that she was experiencing. No radicular symptoms.    Limitations  House hold activities    How long can you sit comfortably?  not limited    How long can you stand comfortably?  not limited    How long can you walk comfortably?  not limited    Diagnostic tests  MRI & X-Ray    Patient Stated Goals  Decrease pain, improve strength    Currently in Pain?  Yes    Pain Score  3     Pain Location  Back    Pain Orientation  Lower    Pain Descriptors / Indicators  Sore    Pain Type  Chronic pain    Pain Onset  More than a month ago         [facility] PT Assessment - 10/01/17 0001      Assessment   Medical Diagnosis   Lumbago with left sciatica    Onset Date/Surgical Date  05/08/17    Prior Therapy  n/a      Precautions   Precautions  None                  [facility] Adult PT Treatment/Exercise - 10/01/17 0001      Lumbar Exercises: Aerobic   Nustep  L4 x10 min      Lumbar Exercises: Standing   Wall Slides  10 reps with core activation    Other Standing Lumbar Exercises  B chop wood pink XTS x15 reps      Lumbar Exercises: Supine   Bridge with March  20 reps;2 seconds    Single Leg Bridge  15 reps      Lumbar Exercises: Sidelying   Hip Abduction  Both;20 reps      Lumbar Exercises: Quadruped   Opposite Arm/Leg Raise  Right arm/Left leg;Left arm/Right leg;10 reps      Modalities   Modalities  Moist Heat;Electrical Stimulation      Moist Heat Therapy   Number Minutes Moist Heat  10 Minutes  Moist Heat Location  Lumbar Spine      Electrical Stimulation   Electrical Stimulation Location  B low back    Electrical Stimulation Action  Pre-Mod    Electrical Stimulation Parameters  80-150 hz x10 min    Electrical Stimulation Goals  Pain             PT Education - 10/01/17 1749    Education provided  Yes    Education Details  HEP- chop wood, SL bridge, QP arm/leg, lateral walks    Person(s) Educated  Patient    Methods  Explanation;Handout    Comprehension  Verbalized understanding       PT Short Term Goals - 09/07/17 1631      PT SHORT TERM GOAL #1   Title  Patient will be independent with HEP.    Time  2    Period  Weeks    Status  New    Target Date  09/21/17      PT SHORT TERM GOAL #2   Title  Patient will decrease pain to less than or equal to 1/10 while performing ADLs.    Time  2    Period  Weeks    Status  New    Target Date  09/21/17        PT Long Term Goals - 09/07/17 1632      PT LONG TERM GOAL #1   Title  Patient self report no neurological symptoms with bending, lifting or functional activities.    Time  6    Period  Weeks    Status  New     Target Date  10/19/17      PT LONG TERM GOAL #2   Title  Patient will improve left hip strength to 5/5 to improve ADLs and functional activities.    Time  6    Period  Weeks    Status  New    Target Date  10/19/17      PT LONG TERM GOAL #3   Title  Patient will improve abdominal strength to 4/5 (come to sitting position arms across chest) improve overall core and spinal stability.    Time  6    Period  Weeks    Status  New    Target Date  10/19/17      PT LONG TERM GOAL #4   Title  Patient will demonstrate proper body and lifting mechanics with no cuing in order to protect back during lifting and changes of positioning.    Time  6    Period  Weeks    Status  New      PT LONG TERM GOAL #5   Title  Patient will report 0/10 pain with all functional activities an ADLs.    Time  6    Period  Weeks    Status  New    Target Date  10/19/17            Plan - 10/01/17 1752    Clinical Impression Statement  Patient tolerated today's treatment well and arrived with low level low back soreness from exercises at home. Patient progressed through more advanced core and LE strengthening wtihout complaint from patient. Core activation promoted by PTA throughout treatment. Lunges were not attempted today secondary to knee problems reported by patient. Minimal instability observed with quadruped arm/leg. Normal modalities response noted following removal. Patient provided a new HEP for more advanced strengthening at home. Patient compliant with log rolling  technique as observed in clinic. Patient also educated regarding a lumbar roll that she could possibly purchase for work station or in her car.    Rehab Potential  Excellent    PT Frequency  2x / week    PT Duration  6 weeks    PT Treatment/Interventions  ADLs/Self Care Home Management;Electrical Stimulation;Ultrasound;Traction;Moist Heat;Iontophoresis 4mg /ml Dexamethasone;Stair training;Functional mobility training;Therapeutic  activities;Therapeutic exercise;Patient/family education;Neuromuscular re-education;Balance training;Manual techniques;Dry needling;Passive range of motion    PT Next Visit Plan  Progress core stabilization as tolerated. Assess body mechanics with lifting    PT Home Exercise Plan  Draw ins, Piriformis stretch, prone press ups on elbows, bridging, 90/90 heel taps, clams, rows and extension    Consulted and Agree with Plan of Care  Patient       Patient will benefit from skilled therapeutic intervention in order to improve the following deficits and impairments:  Pain, Decreased activity tolerance, Decreased strength  Visit Diagnosis: Bilateral low back pain with left-sided sciatica, unspecified chronicity  Muscle weakness (generalized)     Problem List Patient Active Problem List   Diagnosis Date Noted  . Anxiety   . PVC's (premature ventricular contractions) 12/03/2010    Standley Brooking, PTA 10/01/2017, 6:06 PM  [facility] [address] [place], [place], [address] Phone: [phone]   Fax:  [phone]  Name: Julie Cantrell MRN: [id number] Date of Birth: Mar 24, 1966

## 2017-10-06 ENCOUNTER — Ambulatory Visit: Payer: BLUE CROSS/BLUE SHIELD | Admitting: Physical Therapy

## 2017-10-06 ENCOUNTER — Encounter: Payer: Self-pay | Admitting: Physical Therapy

## 2017-10-06 DIAGNOSIS — M6281 Muscle weakness (generalized): Secondary | ICD-10-CM

## 2017-10-06 DIAGNOSIS — M5442 Lumbago with sciatica, left side: Secondary | ICD-10-CM | POA: Diagnosis not present

## 2017-10-06 NOTE — Therapy (Addendum)
[facility] [place], [place], [address] Phone: [phone]   Fax:  [phone]  Physical Therapy Treatment  PHYSICAL THERAPY DISCHARGE SUMMARY  Visits from Start of Care: 5  Current functional level related to goals / functional outcomes: See below   Remaining deficits: See goals   Education / Equipment: HEP Plan: Patient agrees to discharge.  Patient goals were partially met. Patient is being discharged due to not returning since the last visit.  ?????  Gabriela Eves, PT, DPT 05/22/20   Patient Details  Name: Julie Cantrell MRN: [id number] Date of Birth: 05-Aug-1966 Referring Provider: Susa Day, MD   Encounter Date: 10/06/2017  PT End of Session - 10/06/17 1714    Visit Number  5    Number of Visits  12    Date for PT Re-Evaluation  10/19/17    PT Start Time  0071    PT Stop Time  1749    PT Time Calculation (min)  64 min    Activity Tolerance  Patient tolerated treatment well    Behavior During Therapy  [facility] for tasks assessed/performed       Past Medical History:  Diagnosis Date  . Abdominal adhesions   . Anxiety   . ASCUS (atypical squamous cells of undetermined significance) on Pap smear 11/2005   NORMAL PAPS 11/07 AND 4/08  . 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 Pulse irregularity 10-2010    Past Surgical History:  Procedure Laterality Date  . KIDNEY SURGERY  1985  . LAPAROSCOPIC TOTAL HYSTERECTOMY  08/2010   menorrhagia/adenomyosis  . NASAL SINUS SURGERY  1995    There were no vitals filed for this visit.  Subjective Assessment - 10/06/17 1647    Subjective  Reports that she is sore in her low back but doesn't know if she overdid the exercises at home. Reports that for the next two weeks she will miss PT and wonders if she will be okay with HEPs.    Limitations  House hold activities    How long can you sit comfortably?  not limited    How long can you  stand comfortably?  not limited    How long can you walk comfortably?  not limited    Diagnostic tests  MRI & X-Ray    Patient Stated Goals  Decrease pain, improve strength    Currently in Pain?  Yes    Pain Score  5     Pain Location  Back    Pain Orientation  Right;Left;Lower    Pain Descriptors / Indicators  Sore    Pain Type  Chronic pain    Pain Onset  More than a month ago         [facility] PT Assessment - 10/06/17 0001      Assessment   Medical Diagnosis  Lumbago with left sciatica    Onset Date/Surgical Date  05/08/17    Prior Therapy  n/a      Precautions   Precautions  None      ROM / Strength   AROM / PROM / Strength  Strength      Strength   Overall Strength  Within functional limits for tasks performed    Strength Assessment Site  Hip;Knee;Lumbar    Right/Left Hip  Right;Left    Right Hip Flexion  5/5    Right Hip Extension  4+/5    Right Hip ADduction  4+/5    Left Hip Flexion  5/5    Left Hip Extension  4+/5    Left Hip ABduction  4+/5    Right/Left Knee  Right;Left    Right Knee Flexion  5/5    Right Knee Extension  5/5    Left Knee Flexion  4+/5    Left Knee Extension  5/5    Lumbar Flexion  2/5 clear head, scapula, mid back                  [facility] Adult PT Treatment/Exercise - 10/06/17 0001      Lumbar Exercises: Aerobic   Elliptical  R5, L2 x10 min    UBE (Upper Arm Bike)  90 RPM x6 min (3 min forward, 3 min backward) in standing      Lumbar Exercises: Standing   Forward Lunge  15 reps;Other (comment) BLE with 4# reachouts    Wall Slides  20 reps;Other (comment) 4# reachouts    Other Standing Lumbar Exercises  B chop wood pink XTS x15 reps    Other Standing Lumbar Exercises  B lat pulldown pink XTS x20 reps      Lumbar Exercises: Supine   Bridge with March  20 reps;2 seconds    Single Leg Bridge  15 reps    Other Supine Lumbar Exercises  B D2 strengthening with drawin x20 reps each red theraband      Lumbar Exercises: Sidelying    Hip Abduction  Both;20 reps      Modalities   Modalities  Moist Heat;Electrical Stimulation      Moist Heat Therapy   Number Minutes Moist Heat  10 Minutes    Moist Heat Location  Lumbar Spine      Electrical Stimulation   Electrical Stimulation Location  B low back    Electrical Stimulation Action  Pre-Mod    Electrical Stimulation Parameters  80-150 hz x10 min    Electrical Stimulation Goals  Pain               PT Short Term Goals - 10/06/17 1734      PT SHORT TERM GOAL #1   Title  Patient will be independent with HEP.    Time  2    Period  Weeks    Status  Achieved      PT SHORT TERM GOAL #2   Title  Patient will decrease pain to less than or equal to 1/10 while performing ADLs.    Time  2    Period  Weeks    Status  Achieved        PT Long Term Goals - 10/06/17 1735      PT LONG TERM GOAL #1   Title  Patient self report no neurological symptoms with bending, lifting or functional activities.    Time  6    Period  Weeks    Status  Achieved      PT LONG TERM GOAL #2   Title  Patient will improve left hip strength to 5/5 to improve ADLs and functional activities.    Time  6    Period  Weeks    Status  Partially Met 4+/5-5/5 BLE strength 10/06/2017      PT LONG TERM GOAL #3   Title  Patient will improve abdominal strength to 4/5 (come to sitting position arms across chest) improve overall core and spinal stability.    Time  6    Period  Weeks    Status  Not Met  PT LONG TERM GOAL #4   Title  Patient will demonstrate proper body and lifting mechanics with no cuing in order to protect back during lifting and changes of positioning.    Time  6    Period  Weeks    Status  Achieved      PT LONG TERM GOAL #5   Title  Patient will report 0/10 pain with all functional activities an ADLs.    Time  6    Period  Weeks    Status  Partially Met Depends on actvity and pace she is going per patient report 10/06/2017            Plan - 10/06/17  1753    Clinical Impression Statement  Patient tolerated today's treatment well with only reports of low back soreness. Patient instructed that with missing the next few weeks and the improvement she has achieved that holding for 2 weeks with HEP should be okay and to contact facility if pain flares up. Patient guided through more advanced core and functional strengthening without complaint. Patient instructed with body mechanics as well for lifting or pushing. MMT of BLE meaured as 4+/5-5/5 throughout. Still lacking abdominal strength as ab MMT measured as 2/5. Normal modalites response noted following removal of the modalities.     Rehab Potential  Excellent    PT Frequency  2x / week    PT Duration  6 weeks    PT Treatment/Interventions  ADLs/Self Care Home Management;Electrical Stimulation;Ultrasound;Traction;Moist Heat;Iontophoresis '4mg'$ /ml Dexamethasone;Stair training;Functional mobility training;Therapeutic activities;Therapeutic exercise;Patient/family education;Neuromuscular re-education;Balance training;Manual techniques;Dry needling;Passive range of motion    PT Next Visit Plan  Place chart on hold and patient to call back in 2 weeks and continue HEPs.    PT Home Exercise Plan  Draw ins, Piriformis stretch, prone press ups on elbows, bridging, 90/90 heel taps, clams, rows and extension    Consulted and Agree with Plan of Care  Patient       Patient will benefit from skilled therapeutic intervention in order to improve the following deficits and impairments:  Pain, Decreased activity tolerance, Decreased strength  Visit Diagnosis: Bilateral low back pain with left-sided sciatica, unspecified chronicity  Muscle weakness (generalized)     Problem List Patient Active Problem List   Diagnosis Date Noted  . Anxiety   . PVC's (premature ventricular contractions) 12/03/2010    Standley Brooking, PTA 10/06/17 6:06 PM   [facility] [place], [place], [address] Phone: [phone]   Fax:  [phone]  Name: Julie Cantrell MRN: [id number] Date of Birth: 1966-05-27

## 2017-10-14 DIAGNOSIS — N131 Hydronephrosis with ureteral stricture, not elsewhere classified: Secondary | ICD-10-CM | POA: Diagnosis not present

## 2017-10-14 DIAGNOSIS — N1330 Unspecified hydronephrosis: Secondary | ICD-10-CM | POA: Diagnosis not present

## 2018-01-25 ENCOUNTER — Other Ambulatory Visit: Payer: Self-pay | Admitting: Gynecology

## 2018-01-25 DIAGNOSIS — N6012 Diffuse cystic mastopathy of left breast: Secondary | ICD-10-CM

## 2018-01-25 DIAGNOSIS — R920 Mammographic microcalcification found on diagnostic imaging of breast: Secondary | ICD-10-CM

## 2018-02-10 ENCOUNTER — Encounter: Payer: Self-pay | Admitting: Nurse Practitioner

## 2018-02-10 ENCOUNTER — Ambulatory Visit: Payer: BLUE CROSS/BLUE SHIELD | Admitting: Nurse Practitioner

## 2018-02-10 VITALS — BP 116/70 | HR 70 | Temp 97.8°F | Ht 68.0 in | Wt 138.0 lb

## 2018-02-10 DIAGNOSIS — E781 Pure hyperglyceridemia: Secondary | ICD-10-CM | POA: Diagnosis not present

## 2018-02-10 DIAGNOSIS — F419 Anxiety disorder, unspecified: Secondary | ICD-10-CM | POA: Diagnosis not present

## 2018-02-10 MED ORDER — ALPRAZOLAM 0.25 MG PO TABS: ORAL_TABLET | ORAL | 2 refills | Status: DC

## 2018-02-10 NOTE — Progress Notes (Signed)
Subjective:    Patient ID: Julie Cantrell, female    DOB: [birth year], 52 y.o.   MRN: [id number]   Chief Complaint: medical management of chronic issues  HPI:  1. Pure hyperglyceridemia  Patient tries to watch diet and she does exercise a few days a week.  2. Anxiety  She has occasional anxiety and she takes xanax on occasion.    Outpatient Encounter Medications as of 02/10/2018  Medication Sig  . ALPRAZolam (XANAX) 0.25 MG tablet TAKE ONE TABLET AT BEDTIME AS NEEDED  . Calcium Carb-Cholecalciferol (CALCIUM 1000 + D PO) Take by mouth.  . Cholecalciferol (VITAMIN D PO) Take by mouth.  . cyclobenzaprine (FLEXERIL) 5 MG tablet Take 1 tablet (5 mg total) by mouth 3 (three) times daily as needed for muscle spasms.  . diclofenac (VOLTAREN) 75 MG EC tablet Take 1 tablet (75 mg total) by mouth 2 (two) times daily with a meal. (Patient not taking: Reported on 09/07/2017)  . Multiple Vitamin (MULTIVITAMIN) capsule Take 1 capsule by mouth daily.        New complaints: None today  Social history: Lives with husband. Her daughter is in nursing school at [employer]  Constitutional: Negative for activity change and appetite change.  HENT: Negative.   Eyes: Negative for pain.  Respiratory: Negative for shortness of breath.   Cardiovascular: Negative for chest pain, palpitations and leg swelling.  Gastrointestinal: Negative for abdominal pain.  Endocrine: Negative for polydipsia.  Genitourinary: Negative.   Skin: Negative for rash.  Neurological: Negative for dizziness, weakness and headaches.  Hematological: Does not bruise/bleed easily.  Psychiatric/Behavioral: Negative.   All other systems reviewed and are negative.      Objective:   Physical Exam  Constitutional: She is oriented to person, place, and time. She appears well-developed and well-nourished. No distress.  HENT:  Head: Normocephalic.  Nose: Nose normal.  Mouth/Throat: Oropharynx is  clear and moist.  Eyes: Pupils are equal, round, and reactive to light. EOM are normal.  Neck: Normal range of motion. Neck supple. No JVD present. Carotid bruit is not present.  Cardiovascular: Normal rate, regular rhythm, normal heart sounds and intact distal pulses.  Pulmonary/Chest: Effort normal and breath sounds normal. No respiratory distress. She has no wheezes. She has no rales. She exhibits no tenderness.  Abdominal: Soft. Normal appearance, normal aorta and bowel sounds are normal. She exhibits no distension, no abdominal bruit, no pulsatile midline mass and no mass. There is no splenomegaly or hepatomegaly. There is no tenderness.  Musculoskeletal: Normal range of motion. She exhibits no edema.  Lymphadenopathy:    She has no cervical adenopathy.  Neurological: She is alert and oriented to person, place, and time. She has normal reflexes.  Skin: Skin is warm and dry.  Psychiatric: She has a normal mood and affect. Her behavior is normal. Judgment and thought content normal.    BP 116/70   Pulse 70   Temp 97.8 F (36.6 C) (Oral)   Ht '5\' 8"'$  (1.727 m)   Wt 138 lb (62.6 kg)   LMP 09/27/2010   BMI 20.98 kg/m        Assessment & Plan:  Julie Cantrell comes in today with chief complaint of Discuss cholesterol (had it checked at gyn and it was borderline. Was told to follow up)   Diagnosis and orders addressed:  1. Pure hyperglyceridemia Watch fats in diet - CMP14+EGFR - Lipid panel  2. Anxiety Stress management - ALPRAZolam (  XANAX) 0.25 MG tablet; TAKE ONE TABLET AT BEDTIME AS NEEDED  Dispense: 30 tablet; Refill: 2   Labs pending Health Maintenance reviewed Diet and exercise encouraged  Follow up plan: 6 months   Mary-Margaret Hassell Done, FNP

## 2018-02-10 NOTE — Patient Instructions (Signed)
Fat and Cholesterol Restricted Diet High levels of fat and cholesterol in your blood may lead to various health problems, such as diseases of the heart, blood vessels, gallbladder, liver, and pancreas. Fats are concentrated sources of energy that come in various forms. Certain types of fat, including saturated fat, may be harmful in excess. Cholesterol is a substance needed by your body in small amounts. Your body makes all the cholesterol it needs. Excess cholesterol comes from the food you eat. When you have high levels of cholesterol and saturated fat in your blood, health problems can develop because the excess fat and cholesterol will gather along the walls of your blood vessels, causing them to narrow. Choosing the right foods will help you control your intake of fat and cholesterol. This will help keep the levels of these substances in your blood within normal limits and reduce your risk of disease. What is my plan? Your health care provider recommends that you:  Limit your fat intake to ______% or less of your total calories per day.  Limit the amount of cholesterol in your diet to less than _________mg per day.  Eat 20-30 grams of fiber each day.  What types of fat should I choose?  Choose healthy fats more often. Choose monounsaturated and polyunsaturated fats, such as olive and canola oil, flaxseeds, walnuts, almonds, and seeds.  Eat more omega-3 fats. Good choices include salmon, mackerel, sardines, tuna, flaxseed oil, and ground flaxseeds. Aim to eat fish at least two times a week.  Limit saturated fats. Saturated fats are primarily found in animal products, such as meats, butter, and cream. Plant sources of saturated fats include palm oil, palm kernel oil, and coconut oil.  Avoid foods with partially hydrogenated oils in them. These contain trans fats. Examples of foods that contain trans fats are stick margarine, some tub margarines, cookies, crackers, and other baked goods. What  general guidelines do I need to follow? These guidelines for healthy eating will help you control your intake of fat and cholesterol:  Check food labels carefully to identify foods with trans fats or high amounts of saturated fat.  Fill one half of your plate with vegetables and green salads.  Fill one fourth of your plate with whole grains. Look for the word "whole" as the first word in the ingredient list.  Fill one fourth of your plate with lean protein foods.  Limit fruit to two servings a day. Choose fruit instead of juice.  Eat more foods that contain fiber, such as apples, broccoli, carrots, beans, peas, and barley.  Eat more home-cooked food and less restaurant, buffet, and fast food.  Limit or avoid alcohol.  Limit foods high in starch and sugar.  Limit fried foods.  Cook foods using methods other than frying. Baking, boiling, grilling, and broiling are all great options.  Lose weight if you are overweight. Losing just 5-10% of your initial body weight can help your overall health and prevent diseases such as diabetes and heart disease.  What foods can I eat? Grains  Whole grains, such as whole wheat or whole grain breads, crackers, cereals, and pasta. Unsweetened oatmeal, bulgur, barley, quinoa, or brown rice. Corn or whole wheat flour tortillas. Vegetables  Fresh or frozen vegetables (raw, steamed, roasted, or grilled). Green salads. Fruits  All fresh, canned (in natural juice), or frozen fruits. Meats and other protein foods  Ground beef (85% or leaner), grass-fed beef, or beef trimmed of fat. Skinless chicken or turkey. Ground chicken or turkey.   Pork trimmed of fat. All fish and seafood. Eggs. Dried beans, peas, or lentils. Unsalted nuts or seeds. Unsalted canned or dry beans. Dairy  Low-fat dairy products, such as skim or 1% milk, 2% or reduced-fat cheeses, low-fat ricotta or cottage cheese, or plain low-fat yo Fats and oils  Tub margarines without trans  fats. Light or reduced-fat mayonnaise and salad dressings. Avocado. Olive, canola, sesame, or safflower oils. Natural peanut or almond butter (choose ones without added sugar and oil). The items listed above may not be a complete list of recommended foods or beverages. Contact your dietitian for more options. Foods to avoid Grains  White bread. White pasta. White rice. Cornbread. Bagels, pastries, and croissants. Crackers that contain trans fat. Vegetables  White potatoes. Corn. Creamed or fried vegetables. Vegetables in a cheese sauce. Fruits  Dried fruits. Canned fruit in light or heavy syrup. Fruit juice. Meats and other protein foods  Fatty cuts of meat. Ribs, chicken wings, bacon, sausage, bologna, salami, chitterlings, fatback, hot dogs, bratwurst, and packaged luncheon meats. Liver and organ meats. Dairy  Whole or 2% milk, cream, half-and-half, and cream cheese. Whole milk cheeses. Whole-fat or sweetened yogurt. Full-fat cheeses. Nondairy creamers and whipped toppings. Processed cheese, cheese spreads, or cheese curds. Beverages  Alcohol. Sweetened drinks (such as sodas, lemonade, and fruit drinks or punches). Fats and oils  Butter, stick margarine, lard, shortening, ghee, or bacon fat. Coconut, palm kernel, or palm oils. Sweets and desserts  Corn syrup, sugars, honey, and molasses. Candy. Jam and jelly. Syrup. Sweetened cereals. Cookies, pies, cakes, donuts, muffins, and ice cream. The items listed above may not be a complete list of foods and beverages to avoid. Contact your dietitian for more information. This information is not intended to replace advice given to you by your health care provider. Make sure you discuss any questions you have with your health care provider. Document Released: 07/28/2005 Document Revised: 08/18/2014 Document Reviewed: 10/26/2013 Elsevier Interactive Patient Education  2018 [employer]  

## 2018-02-11 LAB — LIPID PANEL
Chol/HDL Ratio: 3.8 ratio (ref 0.0–4.4)
Cholesterol, Total: 213 mg/dL — ABNORMAL HIGH (ref 100–199)
HDL: 56 mg/dL (ref >39)
LDL CALC: 114 mg/dL — AB (ref 0–99)
Triglycerides: 215 mg/dL — ABNORMAL HIGH (ref 0–149)
VLDL Cholesterol Cal: 43 mg/dL — ABNORMAL HIGH (ref 5–40)

## 2018-02-11 LAB — CMP14+EGFR
ALBUMIN: 4.4 g/dL (ref 3.5–5.5)
ALK PHOS: 85 IU/L (ref 39–117)
ALT: 17 IU/L (ref 0–32)
AST: 16 IU/L (ref 0–40)
Albumin/Globulin Ratio: 2.1 (ref 1.2–2.2)
BUN / CREAT RATIO: 23 (ref 9–23)
BUN: 18 mg/dL (ref 6–24)
Bilirubin Total: 0.3 mg/dL (ref 0.0–1.2)
CALCIUM: 9.4 mg/dL (ref 8.7–10.2)
CO2: 25 mmol/L (ref 20–29)
CREATININE: 0.77 mg/dL (ref 0.57–1.00)
Chloride: 103 mmol/L (ref 96–106)
GFR calc Af Amer: 103 mL/min/{1.73_m2} (ref >59)
GFR, EST NON AFRICAN AMERICAN: 89 mL/min/{1.73_m2} (ref >59)
GLOBULIN, TOTAL: 2.1 g/dL (ref 1.5–4.5)
Glucose: 108 mg/dL — ABNORMAL HIGH (ref 65–99)
Potassium: 4.2 mmol/L (ref 3.5–5.2)
SODIUM: 141 mmol/L (ref 134–144)
Total Protein: 6.5 g/dL (ref 6.0–8.5)

## 2018-03-02 ENCOUNTER — Ambulatory Visit
Admission: RE | Admit: 2018-03-02 | Discharge: 2018-03-02 | Disposition: A | Discharge status: Home / Self Care | Payer: BLUE CROSS/BLUE SHIELD | Source: Ambulatory Visit | Attending: Gynecology | Admitting: Gynecology

## 2018-03-02 DIAGNOSIS — R920 Mammographic microcalcification found on diagnostic imaging of breast: Secondary | ICD-10-CM | POA: Diagnosis not present

## 2018-03-02 DIAGNOSIS — N6012 Diffuse cystic mastopathy of left breast: Secondary | ICD-10-CM

## 2018-03-26 ENCOUNTER — Telehealth: Payer: Self-pay | Admitting: Nurse Practitioner

## 2018-03-26 NOTE — Telephone Encounter (Signed)
Patient aware of results.

## 2018-04-02 ENCOUNTER — Ambulatory Visit: Payer: BLUE CROSS/BLUE SHIELD | Admitting: Nurse Practitioner

## 2018-04-02 ENCOUNTER — Encounter: Payer: Self-pay | Admitting: Nurse Practitioner

## 2018-04-02 VITALS — BP 123/72 | HR 65 | Temp 97.8°F | Ht 68.0 in | Wt 138.0 lb

## 2018-04-02 DIAGNOSIS — R05 Cough: Secondary | ICD-10-CM

## 2018-04-02 DIAGNOSIS — R059 Cough, unspecified: Secondary | ICD-10-CM

## 2018-04-02 DIAGNOSIS — J0101 Acute recurrent maxillary sinusitis: Secondary | ICD-10-CM

## 2018-04-02 MED ORDER — AMOXICILLIN-POT CLAVULANATE 875-125 MG PO TABS: 1.0000 | ORAL_TABLET | Freq: Two times a day (BID) | ORAL | 0 refills | Status: DC

## 2018-04-02 MED ORDER — METHYLPREDNISOLONE ACETATE 80 MG/ML IJ SUSP
80.0000 mg | Freq: Once | INTRAMUSCULAR | Status: AC
  Administered 2018-04-02: 80 mg via INTRAMUSCULAR

## 2018-04-02 MED ORDER — HYDROCODONE-HOMATROPINE 5-1.5 MG/5ML PO SYRP: 5.0000 mL | ORAL_SOLUTION | Freq: Four times a day (QID) | ORAL | 0 refills | Status: DC | PRN

## 2018-04-02 NOTE — Patient Instructions (Signed)

## 2018-04-02 NOTE — Progress Notes (Signed)
   Subjective:    Patient ID: Julie Cantrell, female    DOB: August 16, 1965, 52 y.o.   MRN: [id number]   Chief Complaint: Sinusitis   HPI Patient come sin c/o congestion and facial pressure. Slight cough. Started last weekend. Is worse today.    Review of Systems  Constitutional: Positive for fatigue. Negative for chills and fever.  HENT: Positive for congestion, sinus pressure, sinus pain and sore throat. Negative for trouble swallowing and voice change.   Respiratory: Positive for cough (productive- yellowish brown).   Cardiovascular: Negative.   Gastrointestinal: Negative.   Genitourinary: Negative.   Skin: Negative.   Neurological: Positive for headaches.  Psychiatric/Behavioral: Negative.   All other systems reviewed and are negative.      Objective:   Physical Exam  Constitutional: She is oriented to person, place, and time. She appears well-developed and well-nourished. No distress.  HENT:  Head: Normocephalic.  Right Ear: Hearing, tympanic membrane, external ear and ear canal normal.  Left Ear: Hearing, tympanic membrane, external ear and ear canal normal.  Nose: Mucosal edema and rhinorrhea present. Right sinus exhibits maxillary sinus tenderness. Right sinus exhibits no frontal sinus tenderness. Left sinus exhibits maxillary sinus tenderness. Left sinus exhibits no frontal sinus tenderness.  Mouth/Throat: Uvula is midline, oropharynx is clear and moist and mucous membranes are normal.  Eyes: Pupils are equal, round, and reactive to light. EOM are normal.  Neck: Normal range of motion. Neck supple.  Cardiovascular: Normal rate.  Pulmonary/Chest: Effort normal.  Neurological: She is alert and oriented to person, place, and time.  Skin: Skin is warm.  Psychiatric: She has a normal mood and affect. Her behavior is normal. Thought content normal.    BP 123/72   Pulse 65   Temp 97.8 F (36.6 C) (Oral)   Ht 5\' 8"  (1.727 m)   Wt 138 lb (62.6 kg)   LMP 09/27/2010   BMI  20.98 kg/m        Assessment & Plan:  Julie Cantrell in today with chief complaint of Sinusitis   1. Acute recurrent maxillary sinusitis 1. Take meds as prescribed 2. Use a cool mist humidifier especially during the winter months and when heat has been humid. 3. Use saline nose sprays frequently 4. Saline irrigations of the nose can be very helpful if done frequently.  * 4X daily for 1 week*  * Use of a nettie pot can be helpful with this. Follow directions with this* 5. Drink plenty of fluids 6. Keep thermostat turn down low 7.For any cough or congestion  Use plain Mucinex- regular strength or max strength is fine   * Children- consult with Pharmacist for dosing 8. For fever or aces or pains- take tylenol or ibuprofen appropriate for age and weight.  * for fevers greater than 101 orally you may alternate ibuprofen and tylenol every  3 hours.    - amoxicillin-clavulanate (AUGMENTIN) 875-125 MG tablet; Take 1 tablet by mouth 2 (two) times daily.  Dispense: 14 tablet; Refill: 0  2. Cough - HYDROcodone-homatropine (HYCODAN) 5-1.5 MG/5ML syrup; Take 5 mLs by mouth every 6 (six) hours as needed for cough.  Dispense: 120 mL; Refill: 0   Mary-Margaret Hassell Done, FNP

## 2018-05-19 ENCOUNTER — Ambulatory Visit (INDEPENDENT_AMBULATORY_CARE_PROVIDER_SITE_OTHER): Payer: BLUE CROSS/BLUE SHIELD

## 2018-05-19 DIAGNOSIS — Z23 Encounter for immunization: Secondary | ICD-10-CM | POA: Diagnosis not present

## 2018-06-02 ENCOUNTER — Encounter: Payer: Self-pay | Admitting: *Deleted

## 2018-07-14 ENCOUNTER — Other Ambulatory Visit: Payer: Self-pay | Admitting: Gynecology

## 2018-07-14 DIAGNOSIS — Z1231 Encounter for screening mammogram for malignant neoplasm of breast: Secondary | ICD-10-CM

## 2018-08-23 ENCOUNTER — Encounter: Payer: Self-pay | Admitting: Nurse Practitioner

## 2018-08-23 ENCOUNTER — Ambulatory Visit: Payer: BLUE CROSS/BLUE SHIELD | Admitting: Nurse Practitioner

## 2018-08-23 VITALS — BP 111/71 | HR 68 | Temp 96.9°F | Ht 68.0 in | Wt 137.0 lb

## 2018-08-23 DIAGNOSIS — F419 Anxiety disorder, unspecified: Secondary | ICD-10-CM | POA: Diagnosis not present

## 2018-08-23 DIAGNOSIS — M7712 Lateral epicondylitis, left elbow: Secondary | ICD-10-CM | POA: Diagnosis not present

## 2018-08-23 MED ORDER — NAPROXEN 500 MG PO TABS: 500.0000 mg | ORAL_TABLET | Freq: Two times a day (BID) | ORAL | 1 refills | Status: DC

## 2018-08-23 MED ORDER — ALPRAZOLAM 0.25 MG PO TABS: ORAL_TABLET | ORAL | 2 refills | Status: DC

## 2018-08-23 NOTE — Progress Notes (Signed)
Subjective:    Patient ID: Julie Cantrell, female    DOB: September 11, 1965, 53 y.o.   MRN: [id number]   Chief Complaint: Medical Management of Chronic Issues (left elbow pain  No injury)   HPI:  1. Anxiety  Patient is under a lot of stress. Her daughter just graduated from college and is getting married in 2 weeks and will be moving away. Her soon to be husband is in the [employer]. Patient is having a real hard time dealing with all the changes in her life. Depression screen [facility] 2/9 08/23/2018 04/02/2018 02/10/2018  Decreased Interest 0 0 0  Down, Depressed, Hopeless 0 0 0  PHQ - 2 Score 0 0 0       Outpatient Encounter Medications as of 08/23/2018  Medication Sig  . ALPRAZolam (XANAX) 0.25 MG tablet TAKE ONE TABLET AT BEDTIME AS NEEDED  . BIOTIN PO Take by mouth.  . Calcium Carb-Cholecalciferol (CALCIUM 1000 + D PO) Take by mouth.  . Cholecalciferol (VITAMIN D PO) Take by mouth.  . Multiple Vitamin (MULTIVITAMIN) capsule Take 1 capsule by mouth daily.    . Omega-3 Fatty Acids (FISH OIL PO) Take by mouth.       New complaints: Patient c/o dull ache in left elbow. Pain increases with use. Started about 2 weeks ago. Rate [place] 4/10 today. Denies any injury. Ibuprofen helps some.  Social history: Daughter getting married in 2 weeks   Review of Systems  Constitutional: Negative for activity change and appetite change.  HENT: Negative.   Eyes: Negative for pain.  Respiratory: Negative for shortness of breath.   Cardiovascular: Negative for chest pain, palpitations and leg swelling.  Gastrointestinal: Negative for abdominal pain.  Endocrine: Negative for polydipsia.  Genitourinary: Negative.   Skin: Negative for rash.  Neurological: Negative for dizziness, weakness and headaches.  Hematological: Does not bruise/bleed easily.  Psychiatric/Behavioral: Negative.   All other systems reviewed and are negative.      Objective:   Physical Exam Vitals signs and nursing note  reviewed.  Constitutional:      General: She is not in acute distress.    Appearance: Normal appearance. She is well-developed.  HENT:     Head: Normocephalic.     Nose: Nose normal.  Eyes:     Pupils: Pupils are equal, round, and reactive to light.  Neck:     Musculoskeletal: Normal range of motion and neck supple.     Vascular: No carotid bruit or JVD.  Cardiovascular:     Rate and Rhythm: Normal rate and regular rhythm.     Heart sounds: Normal heart sounds.  Pulmonary:     Effort: Pulmonary effort is normal. No respiratory distress.     Breath sounds: Normal breath sounds. No wheezing or rales.  Chest:     Chest wall: No tenderness.  Abdominal:     General: Bowel sounds are normal. There is no distension or abdominal bruit.     Palpations: Abdomen is soft. There is no hepatomegaly, splenomegaly, mass or pulsatile mass.     Tenderness: There is no abdominal tenderness.  Musculoskeletal: Normal range of motion.     Comments: Left lateral epicondyle pain on palpation Grips equal bil.  Lymphadenopathy:     Cervical: No cervical adenopathy.  Skin:    General: Skin is warm and dry.  Neurological:     Mental Status: She is alert and oriented to person, place, and time.     Deep Tendon Reflexes: Reflexes are  normal and symmetric.  Psychiatric:        Behavior: Behavior normal.        Thought Content: Thought content normal.        Judgment: Judgment normal.     Comments: Tearful during exam   BP 111/71   Pulse 68   Temp (!) 96.9 F (36.1 C) (Oral)   Ht 5\' 8"  (1.727 m)   Wt 137 lb (62.1 kg)   LMP 09/27/2010   BMI 20.83 kg/m       Assessment & Plan:  Julie Cantrell comes in today with chief complaint of Medical Management of Chronic Issues (left elbow pain  No injury)   Diagnosis and orders addressed:  1. Anxiety Stress management - ALPRAZolam (XANAX) 0.25 MG tablet; TAKE ONE TABLET AT BEDTIME AS NEEDED  Dispense: 30 tablet; Refill: 2  2. Left lateral  epicondylitis Tennis elbow strap .naprosyn 500mg  1 po BID #60 2 refills   Labs pending Health Maintenance reviewed Diet and exercise encouraged  Follow up plan: prn   Mary-Margaret Hassell Done, FNP

## 2018-08-23 NOTE — Patient Instructions (Signed)
Tennis Elbow  Tennis elbow is swelling (inflammation) in your outer forearm, near your elbow. Swelling affects the tissues that connect muscle to bone (tendons). Tennis elbow can happen in any sport or job in which you use your elbow too much. It is caused by doing the same motion over and over. Tennis elbow can cause:   Pain and tenderness in your forearm and the outer part of your elbow. You may have pain all the time, or only when using the arm.   A burning feeling. This runs from your elbow through your arm.   Weak grip in your hand.  Follow these instructions at home:  Activity   Rest your elbow and wrist. Avoid activities that cause problems, as told by your doctor.   If told by your doctor, wear an elbow strap to reduce stress on the area.   Do physical therapy exercises as told.   If you lift an object, lift it with your palm facing up. This is easier on your elbow.  Lifestyle   If your tennis elbow is caused by sports, check your equipment and make sure that:  ? You are using it correctly.  ? It fits you well.   If your tennis elbow is caused by work or by using a computer, take breaks often to stretch your arm. Talk with your manager about how you can manage your condition at work.  If you have a brace:   Wear the brace as told by your doctor. Remove it only as told by your doctor.   Loosen the brace if your fingers tingle, get numb, or turn cold and blue.   Keep the brace clean.   If the brace is not waterproof, ask your doctor if you may take the brace off for bathing. If you must keep the brace on while bathing:  ? Do not let it get wet.  ? Cover it with a watertight covering when you take a bath or a shower.  General instructions     If told, put ice on the painful area:  ? Put ice in a plastic bag.  ? Place a towel between your skin and the bag.  ? Leave the ice on for 20 minutes, 2-3 times a day.   Take over-the-counter and prescription medicines only as told by your doctor.   Keep  all follow-up visits as told by your doctor. This is important.  Contact a doctor if:   Your pain does not get better with treatment.   Your pain gets worse.   You have weakness in your forearm, hand, or fingers.   You cannot feel your forearm, hand, or fingers.  Summary   Tennis elbow is swelling (inflammation) in your outer forearm, near your elbow.   Tennis elbow is caused by doing the same motion over and over.   Rest your elbow and wrist. Avoid activities that cause problems, as told by your doctor.   If told, put ice on the painful area for 20 minutes, 2-3 times a day.  This information is not intended to replace advice given to you by your health care provider. Make sure you discuss any questions you have with your health care provider.  Document Released: 01/15/2010 Document Revised: 05/12/2017 Document Reviewed: 05/12/2017  Elsevier Interactive Patient Education  2019 [employer] Inc[employer]

## 2018-08-26 ENCOUNTER — Encounter: Payer: Self-pay | Admitting: Gynecology

## 2018-08-26 ENCOUNTER — Ambulatory Visit: Payer: BLUE CROSS/BLUE SHIELD | Admitting: Gynecology

## 2018-08-26 ENCOUNTER — Ambulatory Visit: Payer: BLUE CROSS/BLUE SHIELD

## 2018-08-26 ENCOUNTER — Ambulatory Visit
Admission: RE | Admit: 2018-08-26 | Discharge: 2018-08-26 | Disposition: A | Discharge status: Home / Self Care | Payer: BLUE CROSS/BLUE SHIELD | Source: Ambulatory Visit | Attending: Gynecology | Admitting: Gynecology

## 2018-08-26 VITALS — BP 120/78 | Ht 67.0 in | Wt 134.0 lb

## 2018-08-26 DIAGNOSIS — E7800 Pure hypercholesterolemia, unspecified: Secondary | ICD-10-CM

## 2018-08-26 DIAGNOSIS — Z01419 Encounter for gynecological examination (general) (routine) without abnormal findings: Secondary | ICD-10-CM | POA: Diagnosis not present

## 2018-08-26 DIAGNOSIS — Z1231 Encounter for screening mammogram for malignant neoplasm of breast: Secondary | ICD-10-CM | POA: Diagnosis not present

## 2018-08-26 NOTE — Patient Instructions (Addendum)
Follow-up for fasting blood work  Follow-up in 1 year for annual exam  Schedule your colonoscopy with either:  [facility]   Address: [address], [place], [place] [address]  [phone]    or  [facility]  Address: [address], [place], [place] [address]  [phone]

## 2018-08-26 NOTE — Progress Notes (Signed)
    Julie Cantrell [birth year] [id number]        53 y.o.  G1P1001 for annual gynecologic exam.  Without gynecologic complaints.  Past medical history,surgical history, problem list, medications, allergies, family history and social history were all reviewed and documented as reviewed in the EPIC chart.  ROS:  Performed with pertinent positives and negatives included in the history, assessment and plan.   Additional significant findings : None   Exam: Julie Cantrell assistant Vitals:   08/26/18 1522  BP: 120/78  Weight: 134 lb (60.8 kg)  Height: 5\' 7"  (1.702 m)   Body mass index is 20.99 kg/m.  General appearance:  Normal affect, orientation and appearance. Skin: Grossly normal HEENT: Without gross lesions.  No cervical or supraclavicular adenopathy. Thyroid normal.  Lungs:  Clear without wheezing, rales or rhonchi Cardiac: RR, without RMG Abdominal:  Soft, nontender, without masses, guarding, rebound, organomegaly or hernia Breasts:  Examined lying and sitting without masses, retractions, discharge or axillary adenopathy. Pelvic:  Ext, BUS, Vagina: Normal with mild atrophic changes  Adnexa: Without masses or tenderness    Anus and perineum: Normal   Rectovaginal: Normal sphincter tone without palpated masses or tenderness.    Assessment/Plan:  53 y.o. G47P1001 female for annual gynecologic exam.  Status post [place] 2012 for menorrhagia/adenomyosis.  1. Postmenopausal.  Was having some menopausal symptoms but these seem to be getting better.  We will continue to monitor report any issues. 2. Mammography coming due now. 3. DEXA never.  Will plan further into the menopause. 4. Uses Xanax occasionally for anxiety.  Recently had refill through her primary provider. 5. Pap smear 2019.  No Pap smear done today.  History of LGSIL and ASCUS 2006 and 2007.  Options to stop screening per current screening guidelines based on hysterectomy history versus less frequent screening intervals  reviewed.  Will readdress on an annual basis. 6. Colonoscopy not yet.  Names and numbers provided and patient is going to arrange for this. 7. Health maintenance.  Future orders placed for fasting CBC, CMP, lipid profile and TSH.  Follow-up in 1 year, sooner as needed.   Julie Auerbach MD, 3:50 PM 08/26/2018

## 2018-08-30 ENCOUNTER — Ambulatory Visit: Payer: BLUE CROSS/BLUE SHIELD | Admitting: Nurse Practitioner

## 2018-09-17 ENCOUNTER — Telehealth: Payer: Self-pay | Admitting: Nurse Practitioner

## 2018-09-17 MED ORDER — OSELTAMIVIR PHOSPHATE 75 MG PO CAPS: 75.0000 mg | ORAL_CAPSULE | Freq: Every day | ORAL | 0 refills | Status: DC

## 2018-09-17 NOTE — Telephone Encounter (Signed)
mifl sent to pharmacy- if not symptomatic need to take 1 a day for 10 days

## 2018-09-17 NOTE — Telephone Encounter (Signed)
Pt aware.

## 2018-09-17 NOTE — Telephone Encounter (Signed)
tamiflu sent to pharmacy- 1 daily fpor 10 to prevent

## 2018-09-30 ENCOUNTER — Encounter: Payer: Self-pay | Admitting: Internal Medicine

## 2018-10-05 ENCOUNTER — Other Ambulatory Visit: Payer: BLUE CROSS/BLUE SHIELD

## 2018-10-05 DIAGNOSIS — Z01419 Encounter for gynecological examination (general) (routine) without abnormal findings: Secondary | ICD-10-CM

## 2018-10-05 DIAGNOSIS — E7800 Pure hypercholesterolemia, unspecified: Secondary | ICD-10-CM

## 2018-10-06 ENCOUNTER — Encounter: Payer: Self-pay | Admitting: Gynecology

## 2018-10-06 LAB — CBC WITH DIFFERENTIAL/PLATELET
Absolute Monocytes: 382 cells/uL (ref 200–950)
Basophils Absolute: 29 cells/uL (ref 0–200)
Basophils Relative: 0.6 %
EOS PCT: 2.5 %
Eosinophils Absolute: 123 cells/uL (ref 15–500)
HCT: 39.5 % (ref 35.0–45.0)
Hemoglobin: 13.1 g/dL (ref 11.7–15.5)
Lymphs Abs: 1303 cells/uL (ref 850–3900)
MCH: 29.8 pg (ref 27.0–33.0)
MCHC: 33.2 g/dL (ref 32.0–36.0)
MCV: 90 fL (ref 80.0–100.0)
MPV: 9.7 fL (ref 7.5–12.5)
Monocytes Relative: 7.8 %
Neutro Abs: 3063 cells/uL (ref 1500–7800)
Neutrophils Relative %: 62.5 %
Platelets: 206 10*3/uL (ref 140–400)
RBC: 4.39 10*6/uL (ref 3.80–5.10)
RDW: 11.8 % (ref 11.0–15.0)
Total Lymphocyte: 26.6 %
WBC: 4.9 10*3/uL (ref 3.8–10.8)

## 2018-10-06 LAB — COMPREHENSIVE METABOLIC PANEL
AG Ratio: 2.3 (calc) (ref 1.0–2.5)
ALT: 17 U/L (ref 6–29)
AST: 20 U/L (ref 10–35)
Albumin: 4.5 g/dL (ref 3.6–5.1)
Alkaline phosphatase (APISO): 67 U/L (ref 37–153)
BUN: 17 mg/dL (ref 7–25)
CO2: 26 mmol/L (ref 20–32)
Calcium: 9.2 mg/dL (ref 8.6–10.4)
Chloride: 105 mmol/L (ref 98–110)
Creat: 0.79 mg/dL (ref 0.50–1.05)
Globulin: 2 g/dL (calc) (ref 1.9–3.7)
Glucose, Bld: 84 mg/dL (ref 65–99)
Potassium: 4.4 mmol/L (ref 3.5–5.3)
Sodium: 140 mmol/L (ref 135–146)
Total Bilirubin: 0.5 mg/dL (ref 0.2–1.2)
Total Protein: 6.5 g/dL (ref 6.1–8.1)

## 2018-10-06 LAB — LIPID PANEL
Cholesterol: 210 mg/dL — ABNORMAL HIGH (ref <200)
HDL: 59 mg/dL (ref >=50)
LDL CHOLESTEROL (CALC): 135 mg/dL — AB
Non-HDL Cholesterol (Calc): 151 mg/dL (calc) — ABNORMAL HIGH (ref <130)
TRIGLYCERIDES: 72 mg/dL (ref <150)
Total CHOL/HDL Ratio: 3.6 (calc) (ref <5.0)

## 2018-10-06 LAB — TSH: TSH: 1.98 mIU/L

## 2018-10-20 NOTE — Telephone Encounter (Signed)
Patient informed with my chart message. 

## 2018-10-21 ENCOUNTER — Other Ambulatory Visit: Payer: Self-pay

## 2018-10-21 ENCOUNTER — Ambulatory Visit (AMBULATORY_SURGERY_CENTER): Payer: Self-pay

## 2018-10-21 ENCOUNTER — Encounter: Payer: Self-pay | Admitting: Internal Medicine

## 2018-10-21 VITALS — Ht 68.0 in | Wt 134.0 lb

## 2018-10-21 DIAGNOSIS — Z1211 Encounter for screening for malignant neoplasm of colon: Secondary | ICD-10-CM

## 2018-10-21 NOTE — Progress Notes (Signed)
Denies allergies to eggs or soy products. Denies complication of anesthesia or sedation. Denies use of weight loss medication. Denies use of O2.   Emmi instructions declined.   Patients temperature was 97.9. Patient has not been out of the country in the last month and has not had any symptoms of flu.

## 2018-11-04 ENCOUNTER — Encounter: Payer: BLUE CROSS/BLUE SHIELD | Admitting: Internal Medicine

## 2018-11-12 ENCOUNTER — Other Ambulatory Visit: Payer: Self-pay

## 2018-11-12 ENCOUNTER — Encounter: Payer: Self-pay | Admitting: Nurse Practitioner

## 2018-11-12 ENCOUNTER — Ambulatory Visit (INDEPENDENT_AMBULATORY_CARE_PROVIDER_SITE_OTHER): Payer: BLUE CROSS/BLUE SHIELD | Admitting: Nurse Practitioner

## 2018-11-12 DIAGNOSIS — H1033 Unspecified acute conjunctivitis, bilateral: Secondary | ICD-10-CM

## 2018-11-12 MED ORDER — POLYMYXIN B-TRIMETHOPRIM 10000-0.1 UNIT/ML-% OP SOLN: 1.0000 [drp] | OPHTHALMIC | 0 refills | Status: DC

## 2018-11-12 NOTE — Progress Notes (Signed)
Patient ID: Julie Cantrell, female   DOB: 02-Mar-1966, 53 y.o.   MRN: [id number]    Virtual Visit via telephone Note  I connected with Julie Cantrell on 11/12/18 at 2:00 PM by telephone and verified that I am speaking with the correct person using two identifiers. Julie Cantrell is currently located at home and no one is currently with her during visit. The provider, Mary-Margaret Hassell Done, FNP is located in their office at time of visit.  I discussed the limitations, risks, security and privacy concerns of performing an evaluation and management service by telephone and the availability of in person appointments. I also discussed with the patient that there may be a patient responsible charge related to this service. The patient expressed understanding and agreed to proceed.   History and Present Illness:   Chief Complaint: Conjunctivitis   HPI Patient calls in today c/o right eye red and draining. Eye lashes were matted together today.  Review of Systems  Constitutional: Negative.   HENT: Negative.   Eyes: Positive for photophobia, discharge and redness.  Respiratory: Negative.   Cardiovascular: Negative.   Skin: Negative.   Neurological: Negative.   Psychiatric/Behavioral: Negative.   All other systems reviewed and are negative.    Observations/Objective: Alert and oriented- answers all questions appropriately  Assessment and Plan: Julie Cantrell in today with chief complaint of Conjunctivitis   1. Acute bacterial conjunctivitis of both eyes Avoid rubbing eyes Warm compresses   Follow Up Instructions: Meds ordered this encounter  Medications  . trimethoprim-polymyxin b (POLYTRIM) ophthalmic solution    Sig: Place 1 drop into both eyes every 4 (four) hours.    Dispense:  10 mL    Refill:  0    Order Specific Question:   Supervising Provider    Answer:   Caryl Pina A [[id number]]       I discussed the assessment and treatment plan with the patient. The  patient was provided an opportunity to ask questions and all were answered. The patient agreed with the plan and demonstrated an understanding of the instructions.   The patient was advised to call back or seek an in-person evaluation if the symptoms worsen or if the condition fails to improve as anticipated.  The above assessment and management plan was discussed with the patient. The patient verbalized understanding of and has agreed to the management plan. Patient is aware to call the clinic if symptoms persist or worsen. Patient is aware when to return to the clinic for a follow-up visit. Patient educated on when it is appropriate to go to the emergency department.    I provided 5 minutes of non-face-to-face time during this encounter.    Mary-Margaret Hassell Done, FNP

## 2018-12-07 ENCOUNTER — Encounter: Payer: BLUE CROSS/BLUE SHIELD | Admitting: Internal Medicine

## 2018-12-14 ENCOUNTER — Telehealth: Payer: Self-pay | Admitting: *Deleted

## 2018-12-14 NOTE — Telephone Encounter (Signed)
Spoke with patient regarding scheduling her screening colonoscopy. She wants to wait until the June schedule is out due to short staffing at her work currently. Will await her call. SM

## 2018-12-22 ENCOUNTER — Telehealth: Payer: Self-pay | Admitting: *Deleted

## 2018-12-22 NOTE — Telephone Encounter (Signed)
Spoke with pt.  Rescheduled with patient for 6/10 @ 1030am.  Will mail new prep instructions.

## 2019-01-17 ENCOUNTER — Telehealth: Payer: Self-pay | Admitting: Internal Medicine

## 2019-01-17 ENCOUNTER — Telehealth: Payer: Self-pay | Admitting: *Deleted

## 2019-01-17 NOTE — Telephone Encounter (Signed)

## 2019-01-17 NOTE — Telephone Encounter (Signed)
Patient notified fine to take benadryl the night of her prep

## 2019-01-19 ENCOUNTER — Other Ambulatory Visit: Payer: Self-pay

## 2019-01-19 ENCOUNTER — Encounter: Payer: Self-pay | Admitting: Internal Medicine

## 2019-01-19 ENCOUNTER — Ambulatory Visit (AMBULATORY_SURGERY_CENTER): Payer: BC Managed Care – PPO | Admitting: Internal Medicine

## 2019-01-19 VITALS — BP 83/60 | HR 62 | Temp 98.5°F | Resp 13 | Ht 68.0 in | Wt 134.0 lb

## 2019-01-19 DIAGNOSIS — K621 Rectal polyp: Secondary | ICD-10-CM

## 2019-01-19 DIAGNOSIS — D123 Benign neoplasm of transverse colon: Secondary | ICD-10-CM

## 2019-01-19 DIAGNOSIS — Z1211 Encounter for screening for malignant neoplasm of colon: Secondary | ICD-10-CM | POA: Diagnosis not present

## 2019-01-19 DIAGNOSIS — K635 Polyp of colon: Secondary | ICD-10-CM

## 2019-01-19 DIAGNOSIS — D128 Benign neoplasm of rectum: Secondary | ICD-10-CM

## 2019-01-19 MED ORDER — SODIUM CHLORIDE 0.9 % IV SOLN: 500.0000 mL | Freq: Once | INTRAVENOUS | Status: DC

## 2019-01-19 NOTE — Progress Notes (Signed)
A/ox3, pleased with MAC, report to RN 

## 2019-01-19 NOTE — Progress Notes (Signed)
Pt's states no medical or surgical changes since previsit or office visit.  Temps-cw Vital signs-jb

## 2019-01-19 NOTE — Patient Instructions (Addendum)
I found and removed 4 tiny polyps.  I will let you know pathology results and when to have another routine colonoscopy by mail and/or My Chart.   I appreciate the opportunity to care for you. Gatha Mayer, MD, FACG   YOU HAD AN ENDOSCOPIC PROCEDURE TODAY AT [facility]:   Refer to the procedure report that was given to you for any specific questions about what was found during the examination.  If the procedure report does not answer your questions, please call your gastroenterologist to clarify.  If you requested that your care partner not be given the details of your procedure findings, then the procedure report has been included in a sealed envelope for you to review at your convenience later.  YOU SHOULD EXPECT: Some feelings of bloating in the abdomen. Passage of more gas than usual.  Walking can help get rid of the air that was put into your GI tract during the procedure and reduce the bloating. If you had a lower endoscopy (such as a colonoscopy or flexible sigmoidoscopy) you may notice spotting of blood in your stool or on the toilet paper. If you underwent a bowel prep for your procedure, you may not have a normal bowel movement for a few days.  Please Note:  You might notice some irritation and congestion in your nose or some drainage.  This is from the oxygen used during your procedure.  There is no need for concern and it should clear up in a day or so.  SYMPTOMS TO REPORT IMMEDIATELY:   Following lower endoscopy (colonoscopy or flexible sigmoidoscopy):  Excessive amounts of blood in the stool  Significant tenderness or worsening of abdominal pains  Swelling of the abdomen that is new, acute  Fever of 100F or higher   For urgent or emergent issues, a gastroenterologist can be reached at any hour by calling [phone].   DIET:  We do recommend a small meal at first, but then you may proceed to your regular diet.  Drink plenty of fluids but you  should avoid alcoholic beverages for 24 hours.  ACTIVITY:  You should plan to take it easy for the rest of today and you should NOT DRIVE or use heavy machinery until tomorrow (because of the sedation medicines used during the test).    FOLLOW UP: Our staff will call the number listed on your records 48-72 hours following your procedure to check on you and address any questions or concerns that you may have regarding the information given to you following your procedure. If we do not reach you, we will leave a message.  We will attempt to reach you two times.  During this call, we will ask if you have developed any symptoms of COVID 19. If you develop any symptoms (ie: fever, flu-like symptoms, shortness of breath, cough etc.) before then, please call [phone].  If you test positive for Covid 19 in the 2 weeks post procedure, please call and report this information to [place].    If any biopsies were taken you will be contacted by phone or by letter within the next 1-3 weeks.  Please call us at [phone] if you have not heard about the biopsies in 3 weeks.    SIGNATURES/CONFIDENTIALITY: You and/or your care partner have signed paperwork which will be entered into your electronic medical record.  These signatures attest to the fact that that the information above on your After Visit Summary has been reviewed and  is understood.  Full responsibility of the confidentiality of this discharge information lies with you and/or your care-partner.

## 2019-01-19 NOTE — Progress Notes (Signed)
Called to room to assist during endoscopic procedure.  Patient ID and intended procedure confirmed with present staff. Received instructions for my participation in the procedure from the performing physician.  

## 2019-01-19 NOTE — Op Note (Signed)
[place] Patient Name: Julie Cantrell Procedure Date: 01/19/2019 9:57 AM MRN: [id number] Endoscopist: Gatha Mayer , MD Age: 53 Referring MD:  Date of Birth: March 04, 1966 Gender: Female Account #: [id number] Procedure:                Colonoscopy Indications:              Screening for colorectal malignant neoplasm, This                            is the patient's first colonoscopy Medicines:                Propofol per Anesthesia, Monitored Anesthesia Care Procedure:                Pre-Anesthesia Assessment:                           - Prior to the procedure, a History and Physical                            was performed, and patient medications and                            allergies were reviewed. The patient's tolerance of                            previous anesthesia was also reviewed. The risks                            and benefits of the procedure and the sedation                            options and risks were discussed with the patient.                            All questions were answered, and informed consent                            was obtained. Prior Anticoagulants: The patient has                            taken no previous anticoagulant or antiplatelet                            agents. ASA Grade Assessment: II - A patient with                            mild systemic disease. After reviewing the risks                            and benefits, the patient was deemed in                            satisfactory condition to undergo the procedure.  After obtaining informed consent, the colonoscope                            was passed under direct vision. Throughout the                            procedure, the patient's blood pressure, pulse, and                            oxygen saturations were monitored continuously. The                            Model PCF-[id number]) scope was introduced   through the anus and advanced to the the cecum,                            identified by appendiceal orifice and ileocecal                            valve. The colonoscopy was performed without                            difficulty. The patient tolerated the procedure                            well. The quality of the bowel preparation was                            good. The bowel preparation used was Miralax via                            split dose instruction. The ileocecal valve,                            appendiceal orifice, and rectum were photographed. Scope In: 10:02:47 AM Scope Out: 10:22:01 AM Scope Withdrawal Time: 0 hours 15 minutes 37 seconds  Total Procedure Duration: 0 hours 19 minutes 14 seconds  Findings:                 The perianal and digital rectal examinations were                            normal.                           Four sessile polyps were found in the rectum and                            distal transverse colon. The polyps were diminutive                            in size. These polyps were removed with a cold                            snare. Resection  and retrieval were complete.                            Verification of patient identification for the                            specimen was done. Estimated blood loss was minimal.                           The exam was otherwise without abnormality on                            direct and retroflexion views. Complications:            No immediate complications. Estimated Blood Loss:     Estimated blood loss was minimal. Impression:               - Four diminutive polyps in the rectum and in the                            distal transverse colon, removed with a cold snare.                            Resected and retrieved.                           - The examination was otherwise normal on direct                            and retroflexion views. Recommendation:           - Patient has a contact  number available for                            emergencies. The signs and symptoms of potential                            delayed complications were discussed with the                            patient. Return to normal activities tomorrow.                            Written discharge instructions were provided to the                            patient.                           - Resume previous diet.                           - Continue present medications.                           - Repeat colonoscopy is recommended. The  colonoscopy date will be determined after pathology                            results from today's exam become available for                            review. Gatha Mayer, MD 01/19/2019 10:29:58 AM This report has been signed electronically.

## 2019-01-21 ENCOUNTER — Telehealth: Payer: Self-pay | Admitting: *Deleted

## 2019-01-21 NOTE — Telephone Encounter (Signed)
  Follow up Call-  Call back number 01/19/2019  Post procedure Call Back phone  # [phone]  Permission to leave phone message Yes  Some recent data might be hidden     Patient questions:  Do you have a fever, pain , or abdominal swelling? No. Pain Score  0 *  Have you tolerated food without any problems? Yes.    Have you been able to return to your normal activities? Yes.    Do you have any questions about your discharge instructions: Diet   No. Medications  No. Follow up visit  No.  Do you have questions or concerns about your Care? No.  Actions: * If pain score is 4 or above: No action needed, pain <4.  1. Have you developed a fever since your procedure? no  2.   Have you had an respiratory symptoms (SOB or cough) since your procedure? no  3.   Have you tested positive for COVID 19 since your procedure no  4.   Have you had any family members/close contacts diagnosed with the COVID 19 since your procedure?  no   If yes to any of these questions please route to Joylene John, RN and Alphonsa Gin, Therapist, sports.

## 2019-01-25 ENCOUNTER — Encounter: Payer: Self-pay | Admitting: Internal Medicine

## 2019-01-25 DIAGNOSIS — Z860100 Personal history of colon polyps, unspecified: Secondary | ICD-10-CM | POA: Insufficient documentation

## 2019-01-25 HISTORY — DX: Personal history of colon polyps, unspecified: Z86.0100

## 2019-01-25 HISTORY — DX: Personal history of colonic polyps: Z86.010

## 2019-01-25 NOTE — Progress Notes (Signed)
2 proximal and 2 rectal hyperplastic polyps Recall colon 5-10 yrs - choose 7 2027 My Chart letter

## 2019-05-04 ENCOUNTER — Encounter: Payer: Self-pay | Admitting: Gynecology

## 2019-05-11 ENCOUNTER — Ambulatory Visit (INDEPENDENT_AMBULATORY_CARE_PROVIDER_SITE_OTHER): Payer: BC Managed Care – PPO

## 2019-05-11 ENCOUNTER — Other Ambulatory Visit: Payer: Self-pay

## 2019-05-11 DIAGNOSIS — Z23 Encounter for immunization: Secondary | ICD-10-CM

## 2019-06-17 DIAGNOSIS — Z20828 Contact with and (suspected) exposure to other viral communicable diseases: Secondary | ICD-10-CM | POA: Diagnosis not present

## 2019-06-21 ENCOUNTER — Other Ambulatory Visit: Payer: Self-pay | Admitting: Gynecology

## 2019-06-21 DIAGNOSIS — Z1231 Encounter for screening mammogram for malignant neoplasm of breast: Secondary | ICD-10-CM

## 2019-06-22 DIAGNOSIS — L821 Other seborrheic keratosis: Secondary | ICD-10-CM | POA: Diagnosis not present

## 2019-06-22 DIAGNOSIS — D225 Melanocytic nevi of trunk: Secondary | ICD-10-CM | POA: Diagnosis not present

## 2019-06-22 DIAGNOSIS — R21 Rash and other nonspecific skin eruption: Secondary | ICD-10-CM | POA: Diagnosis not present

## 2019-06-22 DIAGNOSIS — Q809 Congenital ichthyosis, unspecified: Secondary | ICD-10-CM | POA: Diagnosis not present

## 2019-08-08 DIAGNOSIS — M25512 Pain in left shoulder: Secondary | ICD-10-CM | POA: Diagnosis not present

## 2019-08-30 ENCOUNTER — Ambulatory Visit
Admission: RE | Admit: 2019-08-30 | Discharge: 2019-08-30 | Disposition: A | Discharge status: Home / Self Care | Payer: BC Managed Care – PPO | Source: Ambulatory Visit | Attending: Gynecology | Admitting: Gynecology

## 2019-08-30 ENCOUNTER — Encounter: Payer: BLUE CROSS/BLUE SHIELD | Admitting: Gynecology

## 2019-08-30 ENCOUNTER — Other Ambulatory Visit: Payer: Self-pay

## 2019-08-30 DIAGNOSIS — Z1231 Encounter for screening mammogram for malignant neoplasm of breast: Secondary | ICD-10-CM | POA: Diagnosis not present

## 2019-09-01 ENCOUNTER — Other Ambulatory Visit: Payer: Self-pay | Admitting: Obstetrics & Gynecology

## 2019-09-01 DIAGNOSIS — R928 Other abnormal and inconclusive findings on diagnostic imaging of breast: Secondary | ICD-10-CM

## 2019-09-05 IMAGING — DX DG KNEE 1-2V*R*
3 series · 3 of 3 positions shown · non-contrast
Comparison: None.

CLINICAL DATA: Fell down the steps with knee pain

EXAM:
RIGHT KNEE - 1-2 VIEW

[knee ap (1 of 2)]
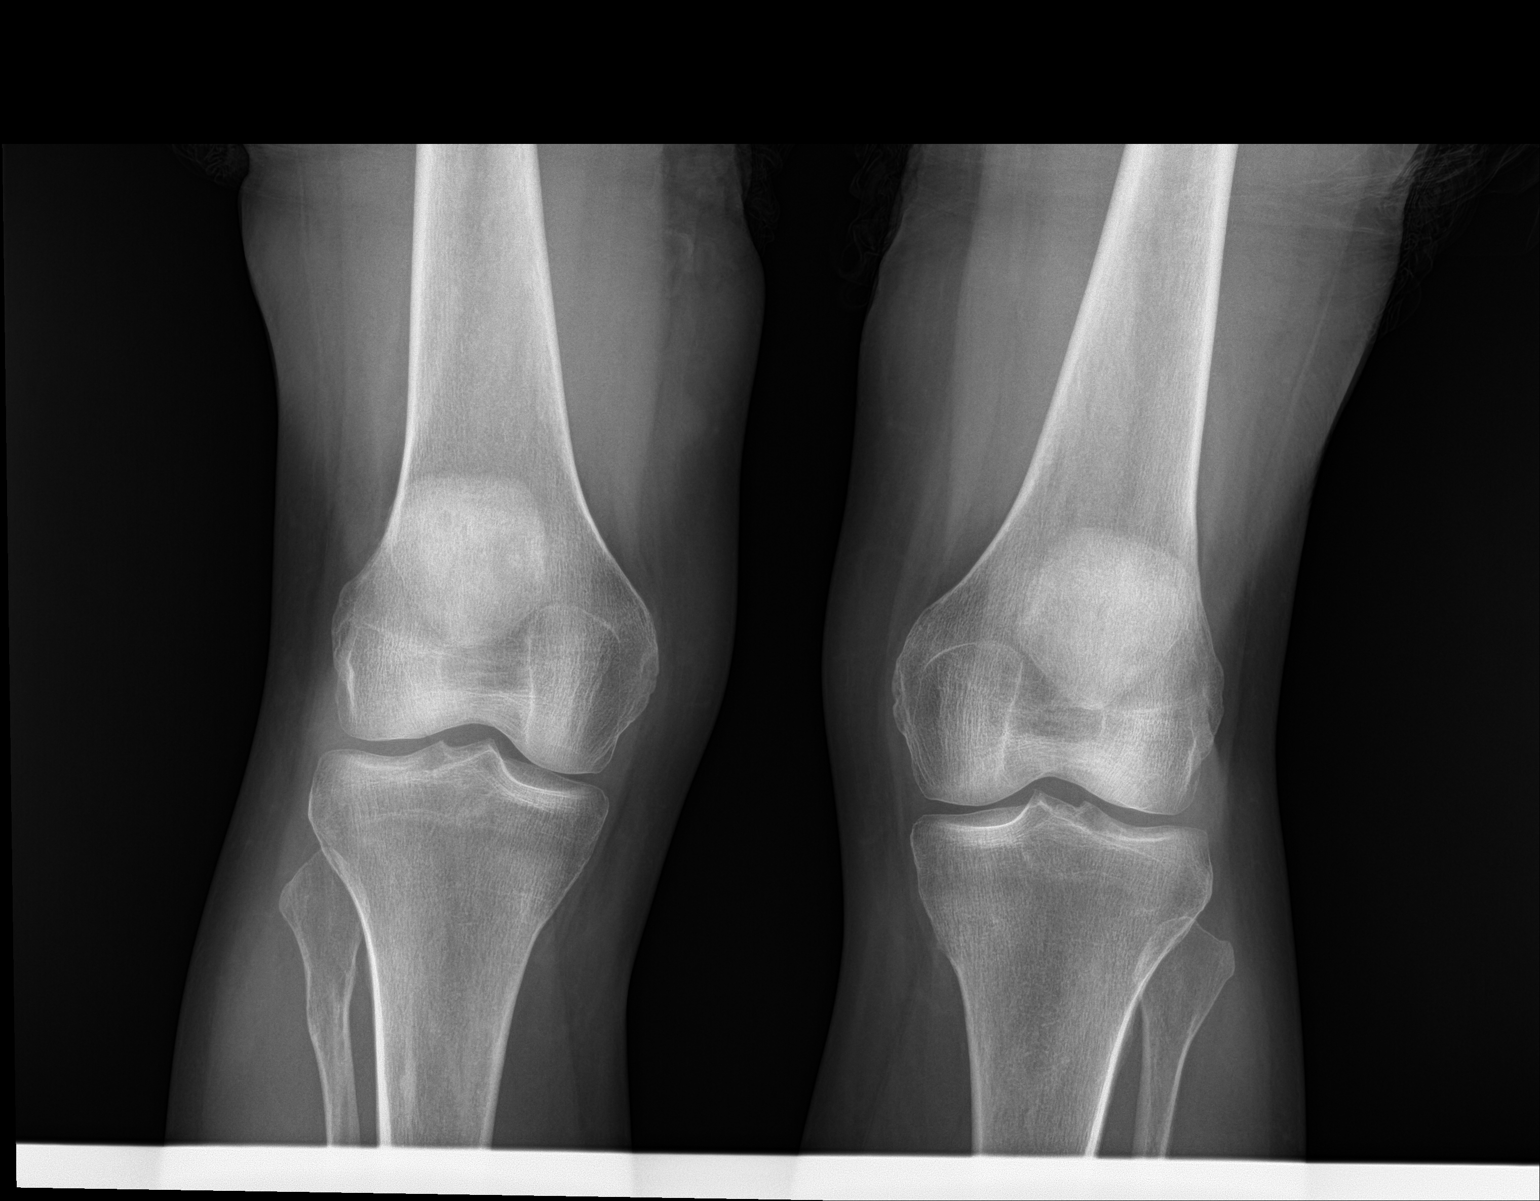

[knee lat]
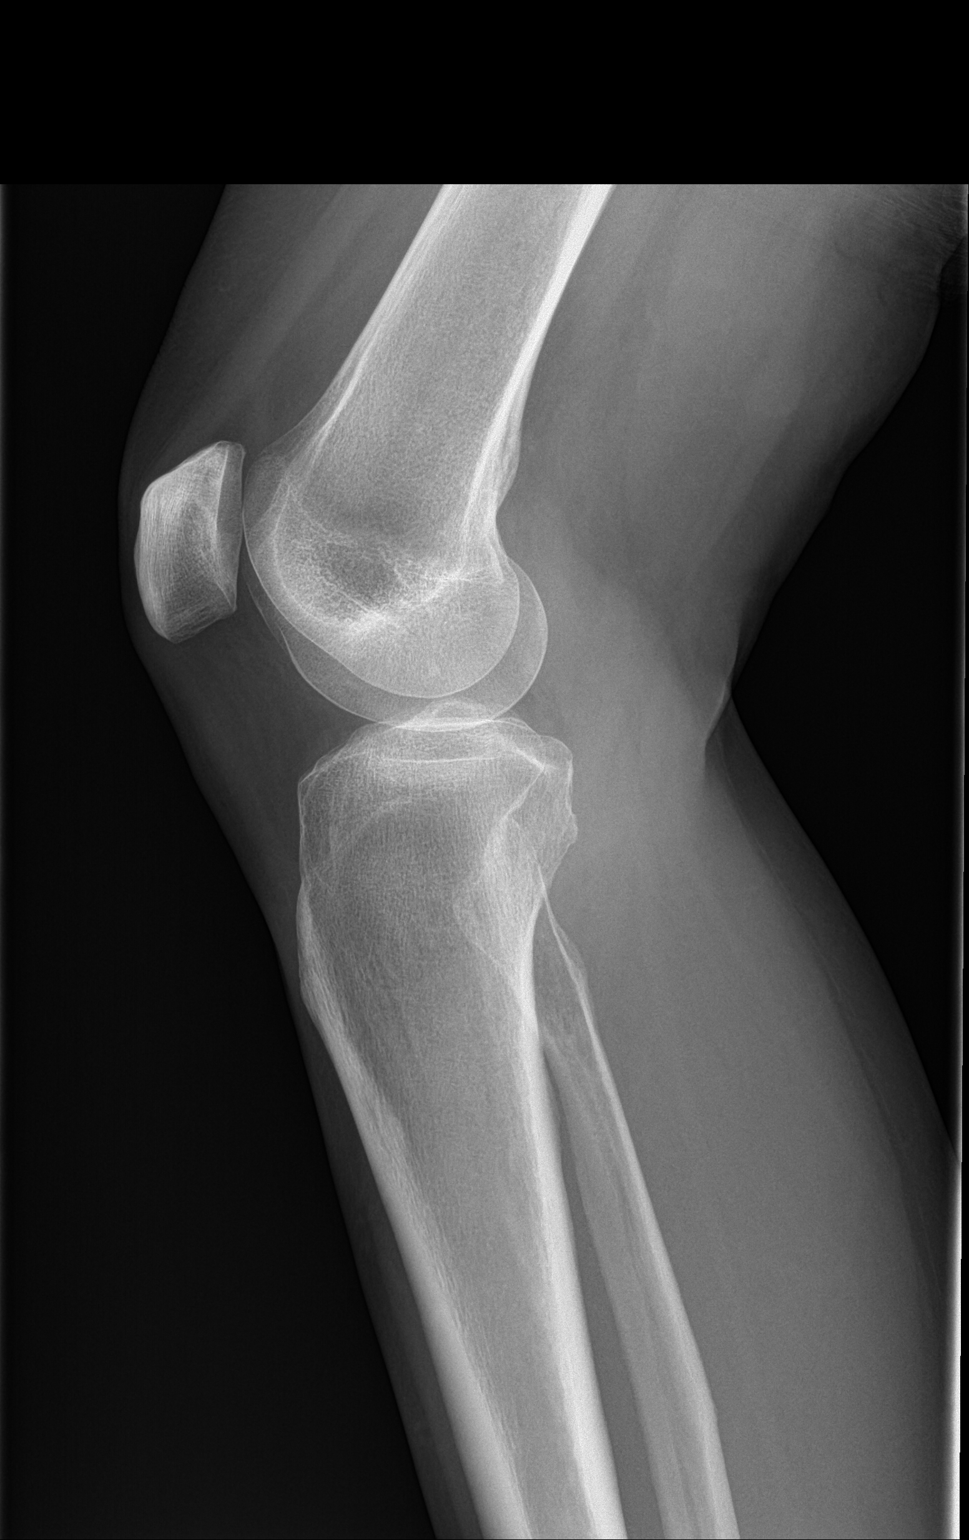

[knee ap (2 of 2)]
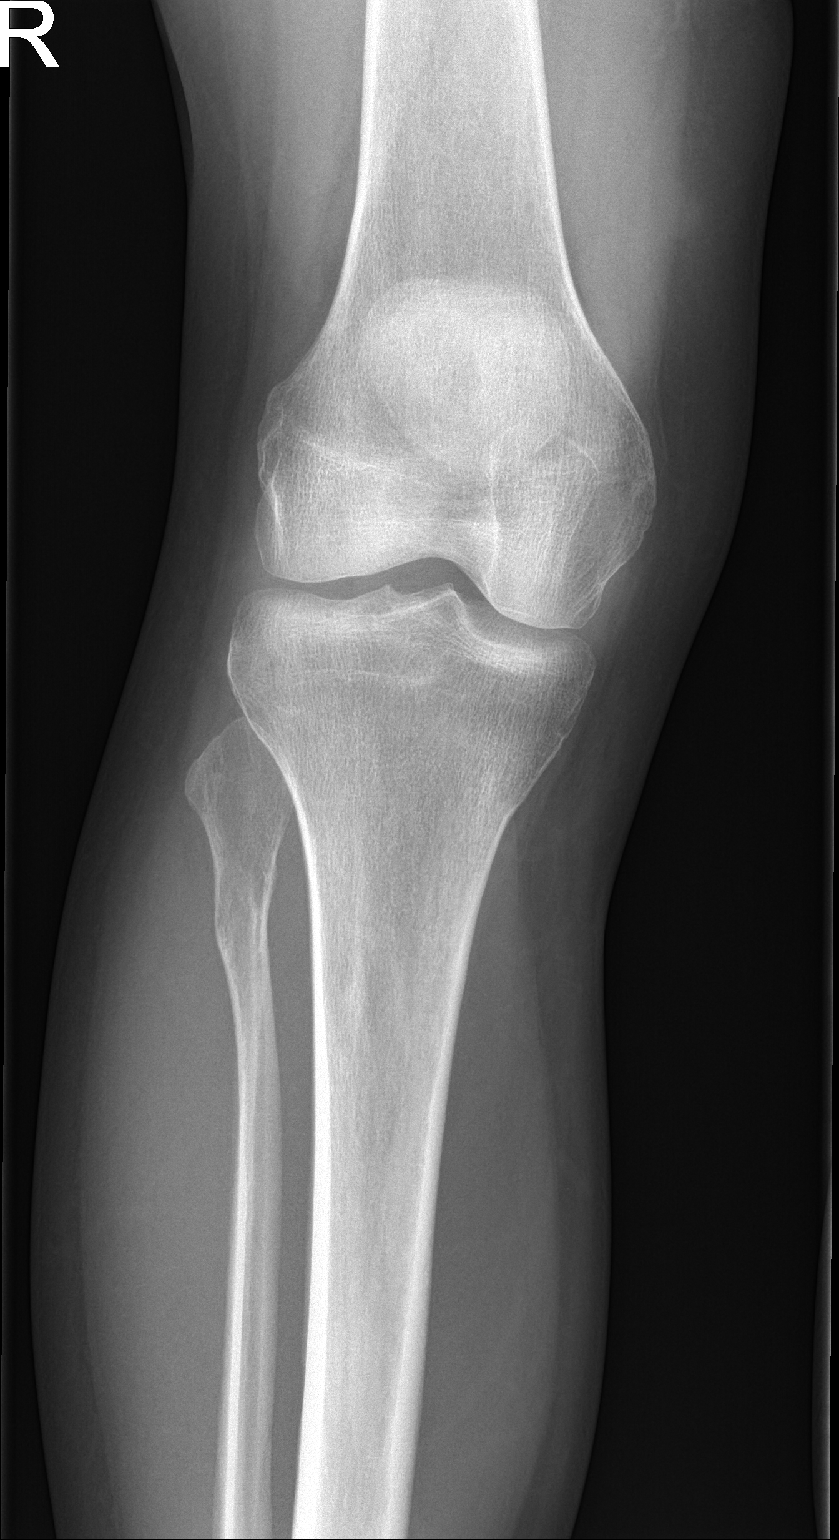

[3 of 3 positions shown; findings below may reference images not displayed]

FINDINGS: No fracture or malalignment. Joint spaces are maintained. Old
appearing fracture deformity of the proximal fibular shaft on the
right. No significant effusion.
IMPRESSION: 1. No acute osseous abnormality
2. Old proximal right fibular fracture

## 2019-09-06 DIAGNOSIS — Z20828 Contact with and (suspected) exposure to other viral communicable diseases: Secondary | ICD-10-CM | POA: Diagnosis not present

## 2019-09-06 DIAGNOSIS — Z03818 Encounter for observation for suspected exposure to other biological agents ruled out: Secondary | ICD-10-CM | POA: Diagnosis not present

## 2019-09-07 ENCOUNTER — Other Ambulatory Visit: Payer: Self-pay

## 2019-09-07 NOTE — Progress Notes (Signed)
This patient needs repeat mammogram imaging.

## 2019-09-08 ENCOUNTER — Encounter: Payer: Self-pay | Admitting: Obstetrics and Gynecology

## 2019-09-08 ENCOUNTER — Ambulatory Visit: Payer: BC Managed Care – PPO | Admitting: Obstetrics and Gynecology

## 2019-09-08 VITALS — BP 120/70 | Ht 68.0 in | Wt 134.0 lb

## 2019-09-08 DIAGNOSIS — Z01419 Encounter for gynecological examination (general) (routine) without abnormal findings: Secondary | ICD-10-CM | POA: Diagnosis not present

## 2019-09-08 NOTE — Patient Instructions (Addendum)
It was nice to meet you today! Please remember to schedule your fasting screening blood work at [facility].

## 2019-09-08 NOTE — Progress Notes (Signed)
Julie Cantrell [facility] 1966-06-10 [id number]  SUBJECTIVE:  54 y.o. G1P1001 female for annual routine gynecologic exam. She has no gynecologic concerns. Seems worried over the dense breast tissue finding on mammogram necessitating follow up study tomorrow.  Current Outpatient Medications  Medication Sig Dispense Refill  . ALPRAZolam (XANAX) 0.25 MG tablet TAKE ONE TABLET AT BEDTIME AS NEEDED 30 tablet 2  . BIOTIN PO Take by mouth.    . Calcium Carb-Cholecalciferol (CALCIUM 1000 + D PO) Take by mouth.    . Cholecalciferol (VITAMIN D PO) Take by mouth.    . cyclobenzaprine (FLEXERIL) 10 MG tablet Take 10 mg by mouth as needed for muscle spasms.    . diphenhydrAMINE (BENADRYL) 25 MG tablet Take 25 mg by mouth as needed.    . meloxicam (MOBIC) 15 MG tablet Take 15 mg by mouth as needed for pain.    . Multiple Vitamin (MULTIVITAMIN) capsule Take 1 capsule by mouth daily.      . naproxen (NAPROSYN) 500 MG tablet Take 1 tablet (500 mg total) by mouth 2 (two) times daily with a meal. 60 tablet 1  . Omega-3 Fatty Acids (FISH OIL PO) Take by mouth.     No current facility-administered medications for this visit.   Allergies: Patient has no known allergies.  Patient's last menstrual period was 09/27/2010.  Past medical history,surgical history, problem list, medications, allergies, family history and social history were all reviewed and documented as reviewed in the EPIC chart.  ROS:  Feeling well. No dyspnea or chest pain on exertion.  No abdominal pain, change in bowel habits, black or bloody stools.  No urinary tract symptoms. GYN ROS: no abnormal bleeding, pelvic pain or discharge, no breast pain or new or enlarging lumps on self exam. No neurological complaints.  OBJECTIVE:  BP 120/70   Ht 5\' 8"  (1.727 m)   Wt 134 lb (60.8 kg)   LMP 09/27/2010   BMI 20.37 kg/m  The patient appears well, alert, oriented x 3, in no distress. ENT normal.  Neck supple. No cervical or supraclavicular  adenopathy or thyromegaly.  Lungs are clear, good air entry, no wheezes, rhonchi or rales. S1 and S2 normal, no murmurs, regular rate and rhythm.  Abdomen soft without tenderness, guarding, mass or organomegaly.  Neurological is normal, no focal findings.  BREAST EXAM: breasts appear normal, no suspicious masses, dense tissue, no skin or nipple changes or axillary nodes  PELVIC EXAM: VULVA: normal appearing vulva with no masses, tenderness or lesions, VAGINA: normal appearing vagina with normal color and discharge, no lesions, CERVIX: surgically absent, UTERUS: surgically absent, vaginal cuff well healed, ADNEXA: no masses  Chaperone: [name] present during the examination  ASSESSMENT:  54 y.o. G1P1001 here for annual gynecologic exam  PLAN:   1. Postmenopausal.  No major vasomotor symptom concerns. 2. Pap smear 2019 was normal. Not repeated today. History of LGSIL and ASCUS 2006 and 2007.  Options to stop screening per current screening guidelines based on hysterectomy history versus less frequent screening intervals reviewed.  Will readdress on an annual basis.Marland Kitchen 3. Mammogram 08/2019 with need for follow up breast ultrasound and tomosynthesis tomorrow due to incomplete study secondary to dense breast tissue. Breast exam normal today. Findings discussed with patient. 4. Colonoscopy 2020. Recommended that she continue per the prescribed interval.  5. Family history osteoporosis.  +Risk factors for osteoporosis.  Recommended getting DEXA in next few years while in 55s.  Encouraged and reviewed recommendation for vitamin D and calcium intake  and weightbearing exercise. 6. Health maintenance.  Future orders placed for CBC, CMP, lipid profile.  Return annually or sooner, prn.  Larey Days MD  09/08/19

## 2019-09-09 ENCOUNTER — Other Ambulatory Visit: Payer: BC Managed Care – PPO

## 2019-09-09 ENCOUNTER — Ambulatory Visit
Admission: RE | Admit: 2019-09-09 | Discharge: 2019-09-09 | Disposition: A | Discharge status: Home / Self Care | Payer: BC Managed Care – PPO | Source: Ambulatory Visit | Attending: Obstetrics & Gynecology | Admitting: Obstetrics & Gynecology

## 2019-09-09 ENCOUNTER — Other Ambulatory Visit: Payer: Self-pay

## 2019-09-09 ENCOUNTER — Other Ambulatory Visit: Payer: Self-pay | Admitting: Obstetrics & Gynecology

## 2019-09-09 DIAGNOSIS — Z01419 Encounter for gynecological examination (general) (routine) without abnormal findings: Secondary | ICD-10-CM | POA: Diagnosis not present

## 2019-09-09 DIAGNOSIS — N6320 Unspecified lump in the left breast, unspecified quadrant: Secondary | ICD-10-CM

## 2019-09-09 DIAGNOSIS — N6489 Other specified disorders of breast: Secondary | ICD-10-CM | POA: Diagnosis not present

## 2019-09-09 DIAGNOSIS — R928 Other abnormal and inconclusive findings on diagnostic imaging of breast: Secondary | ICD-10-CM

## 2019-09-09 DIAGNOSIS — R922 Inconclusive mammogram: Secondary | ICD-10-CM | POA: Diagnosis not present

## 2019-09-09 LAB — COMPREHENSIVE METABOLIC PANEL
AG Ratio: 1.9 (calc) (ref 1.0–2.5)
ALT: 15 U/L (ref 6–29)
AST: 18 U/L (ref 10–35)
Albumin: 4.5 g/dL (ref 3.6–5.1)
Alkaline phosphatase (APISO): 90 U/L (ref 37–153)
BUN: 19 mg/dL (ref 7–25)
CO2: 27 mmol/L (ref 20–32)
Calcium: 9.6 mg/dL (ref 8.6–10.4)
Chloride: 105 mmol/L (ref 98–110)
Creat: 0.74 mg/dL (ref 0.50–1.05)
Globulin: 2.4 g/dL (calc) (ref 1.9–3.7)
Glucose, Bld: 100 mg/dL — ABNORMAL HIGH (ref 65–99)
Potassium: 4.2 mmol/L (ref 3.5–5.3)
Sodium: 140 mmol/L (ref 135–146)
Total Bilirubin: 0.6 mg/dL (ref 0.2–1.2)
Total Protein: 6.9 g/dL (ref 6.1–8.1)

## 2019-09-09 LAB — CBC
HCT: 39.4 % (ref 35.0–45.0)
Hemoglobin: 13.1 g/dL (ref 11.7–15.5)
MCH: 29.9 pg (ref 27.0–33.0)
MCHC: 33.2 g/dL (ref 32.0–36.0)
MCV: 90 fL (ref 80.0–100.0)
MPV: 9.5 fL (ref 7.5–12.5)
Platelets: 179 10*3/uL (ref 140–400)
RBC: 4.38 10*6/uL (ref 3.80–5.10)
RDW: 11.7 % (ref 11.0–15.0)
WBC: 3.3 10*3/uL — ABNORMAL LOW (ref 3.8–10.8)

## 2019-09-09 LAB — LIPID PANEL
Cholesterol: 206 mg/dL — ABNORMAL HIGH (ref <200)
HDL: 57 mg/dL (ref >=50)
LDL Cholesterol (Calc): 129 mg/dL (calc) — ABNORMAL HIGH
Non-HDL Cholesterol (Calc): 149 mg/dL (calc) — ABNORMAL HIGH (ref <130)
Total CHOL/HDL Ratio: 3.6 (calc) (ref <5.0)
Triglycerides: 102 mg/dL (ref <150)

## 2019-09-15 ENCOUNTER — Other Ambulatory Visit: Payer: Self-pay

## 2019-09-15 ENCOUNTER — Ambulatory Visit
Admission: RE | Admit: 2019-09-15 | Discharge: 2019-09-15 | Disposition: A | Discharge status: Home / Self Care | Payer: BC Managed Care – PPO | Source: Ambulatory Visit | Attending: Obstetrics & Gynecology | Admitting: Obstetrics & Gynecology

## 2019-09-15 DIAGNOSIS — N6325 Unspecified lump in the left breast, overlapping quadrants: Secondary | ICD-10-CM | POA: Diagnosis not present

## 2019-09-15 DIAGNOSIS — N6320 Unspecified lump in the left breast, unspecified quadrant: Secondary | ICD-10-CM

## 2019-09-15 DIAGNOSIS — N6012 Diffuse cystic mastopathy of left breast: Secondary | ICD-10-CM | POA: Diagnosis not present

## 2019-09-18 IMAGING — MG 2D DIGITAL SCREENING BILATERAL MAMMOGRAM WITH 3D TOMO WITH CAD
6 series · 6 of 14 positions shown · non-contrast
Comparison: Previous exam(s).

CLINICAL DATA: Screening.

EXAM:
2D DIGITAL SCREENING BILATERAL MAMMOGRAM WITH 3D TOMO WITH CAD

[R CC (1 of 2)]
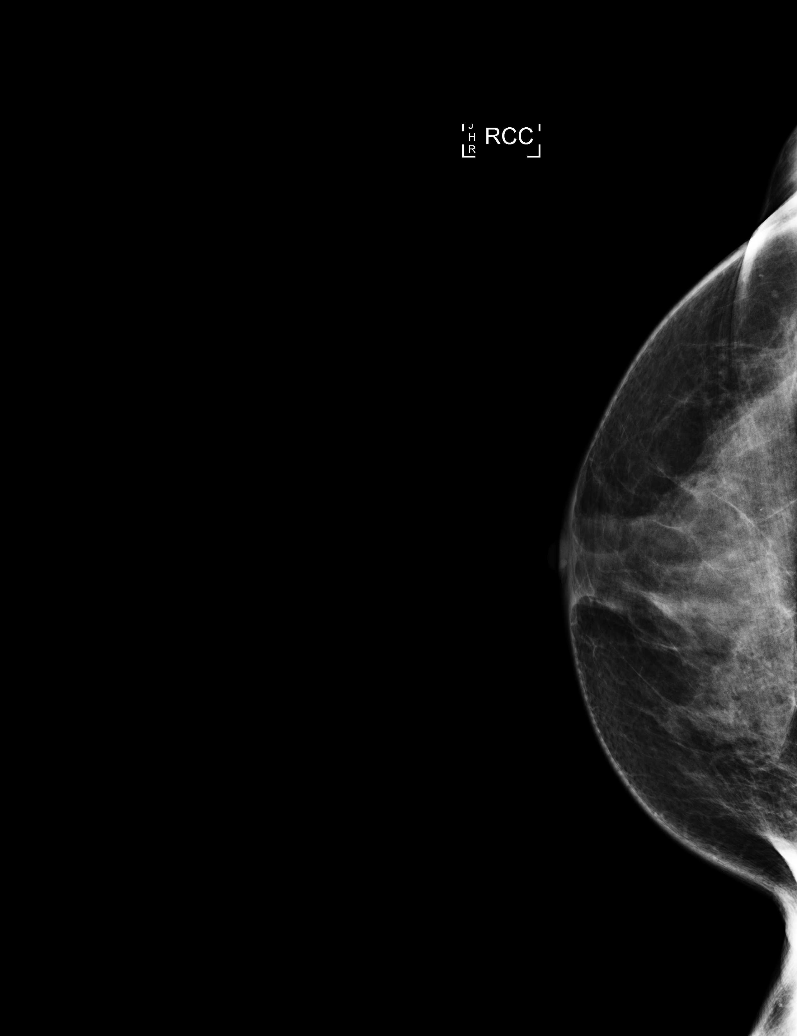

[L CC synth-2D]
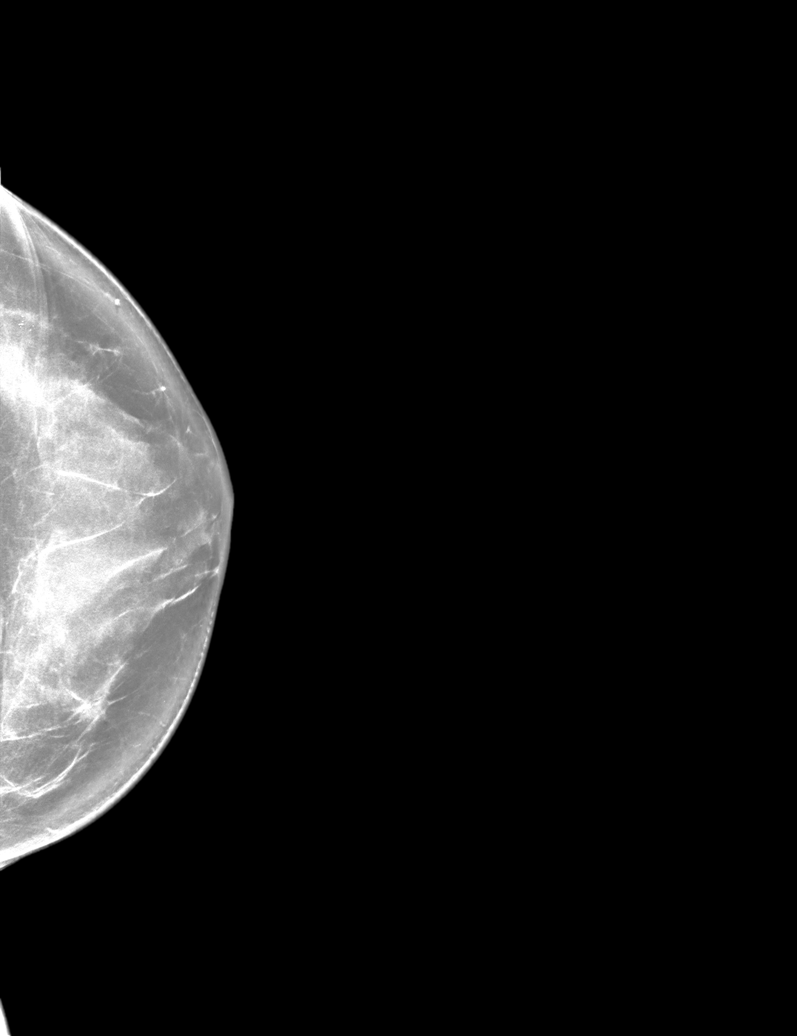

[R CC (2 of 2)]
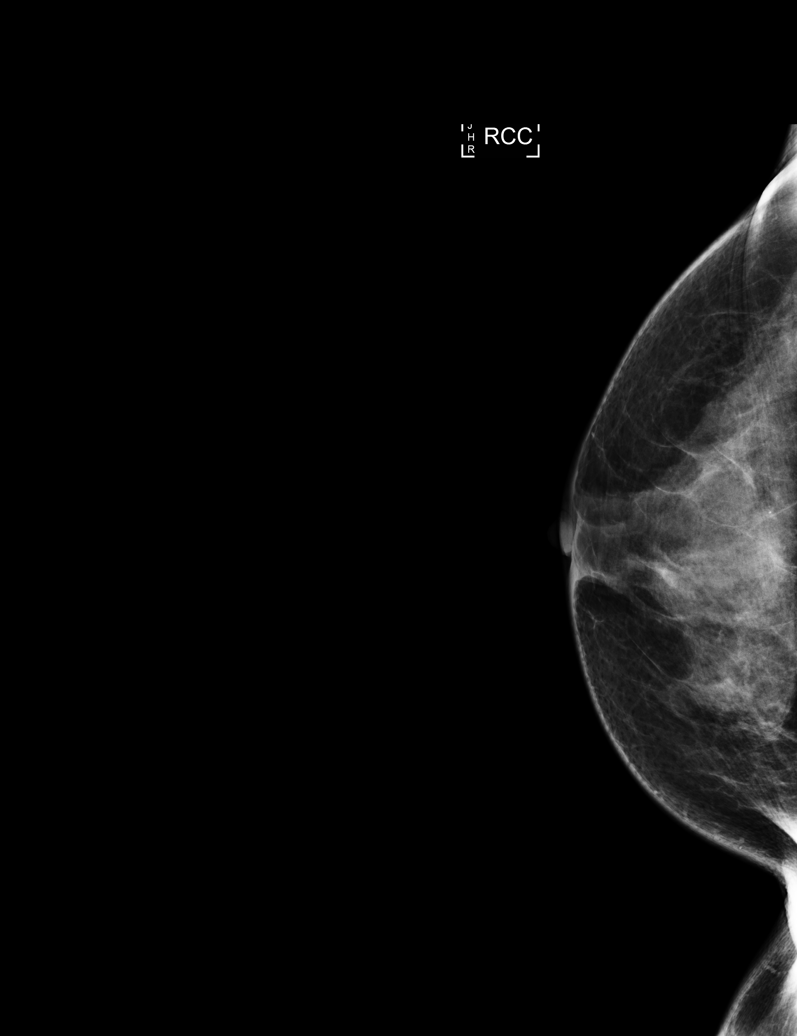

[L CC]
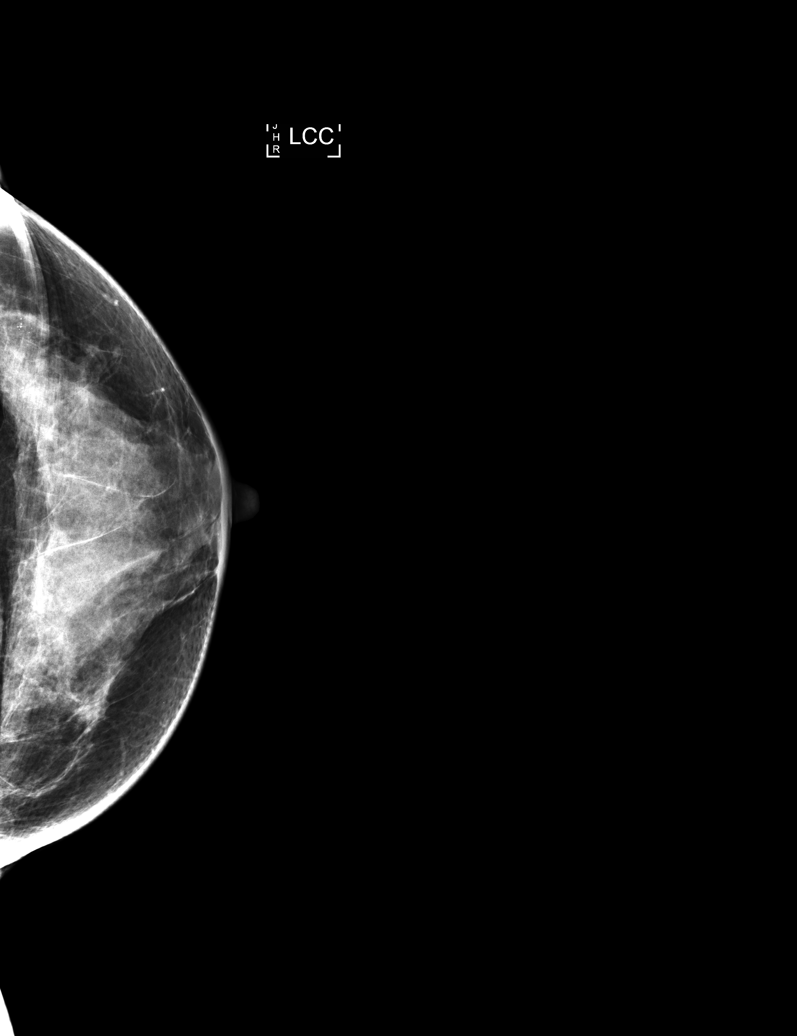

[R CC tomo · tomo slice 38/75.0]
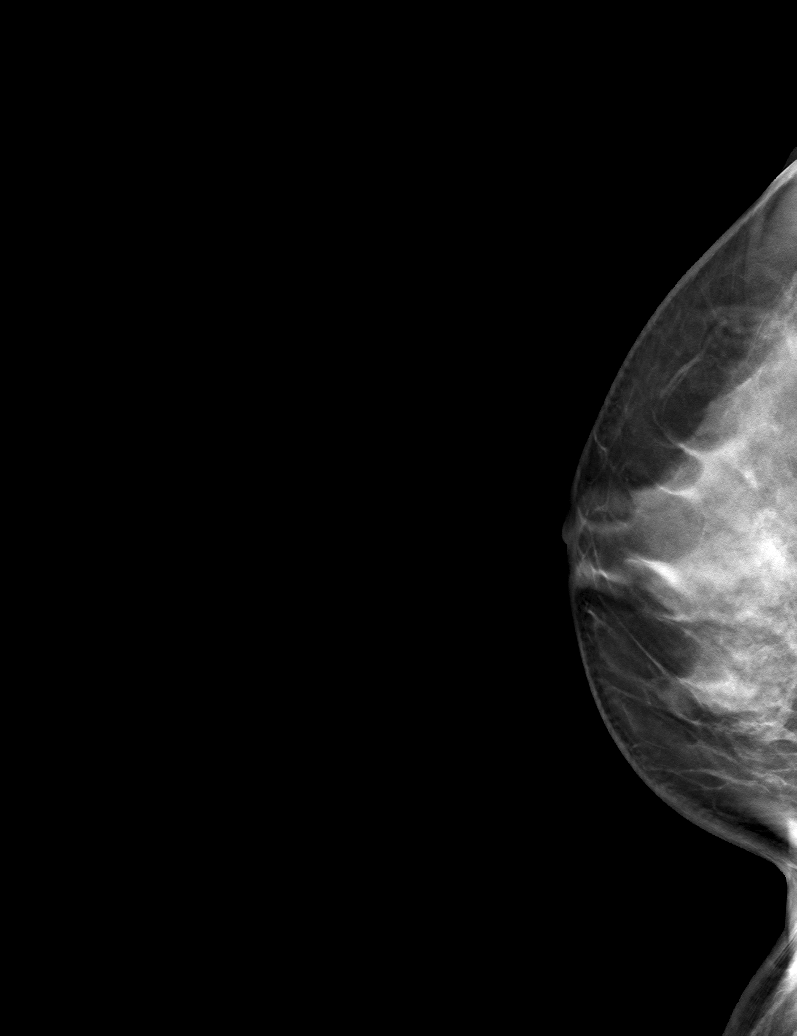

[L MLO tomo · tomo slice 31/60.0]
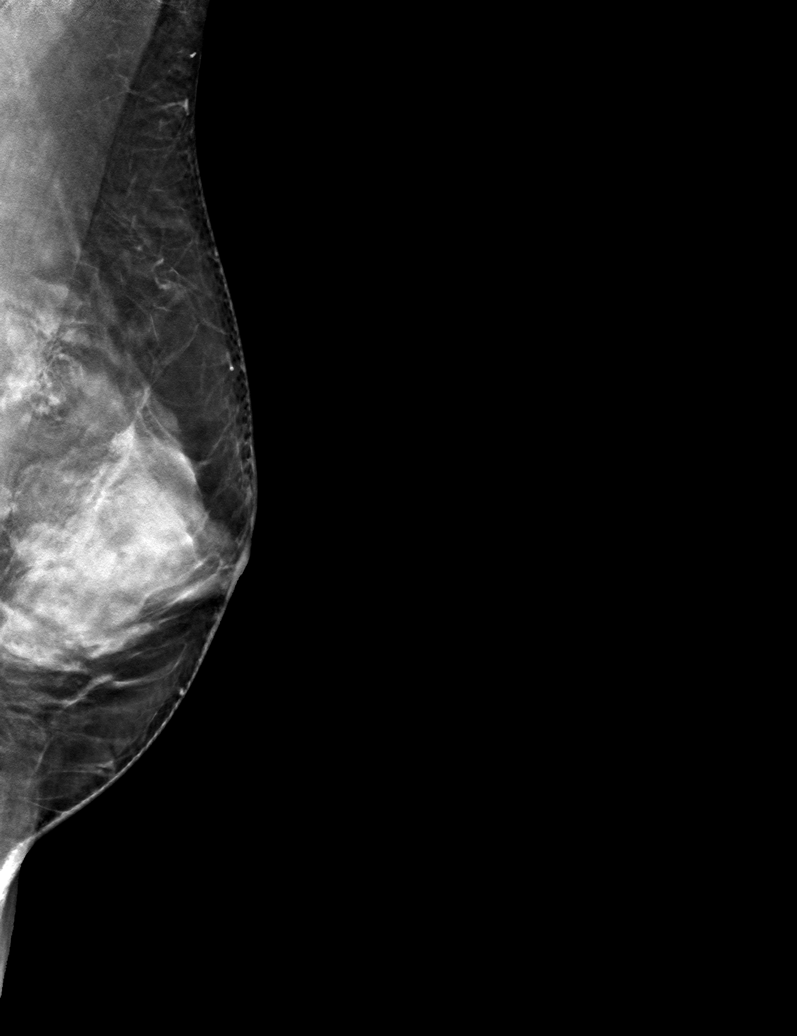

[6 of 14 positions shown; findings below may reference images not displayed]

ACR Breast Density Category c: The breast tissue is heterogeneously
dense, which may obscure small masses.
FINDINGS: In the left breast, calcifications warrant further evaluation. In
the right breast, no findings suspicious for malignancy. Images were
processed with CAD.
IMPRESSION: Further evaluation is suggested for calcifications in the left
breast.

RECOMMENDATION:
Diagnostic mammogram of the left breast. (Code:Q0-3-66U)

The patient will be contacted regarding the findings, and additional
imaging will be scheduled.

BI-RADS CATEGORY  0: Incomplete. Need additional imaging evaluation
and/or prior mammograms for comparison.

## 2019-09-19 ENCOUNTER — Other Ambulatory Visit: Payer: BC Managed Care – PPO

## 2019-10-29 ENCOUNTER — Ambulatory Visit: Payer: BC Managed Care – PPO | Attending: Internal Medicine

## 2019-10-29 DIAGNOSIS — Z23 Encounter for immunization: Secondary | ICD-10-CM

## 2019-10-29 NOTE — Progress Notes (Signed)
   Covid-19 Vaccination Clinic  Name:  [name]    MRN: [id number] DOB: 1966/04/28  10/29/2019  [name] was observed post Covid-19 immunization for 15 minutes without incident. She was provided with Vaccine Information Sheet and instruction to access the V-Safe system.   [name] was instructed to call 911 with any severe reactions post vaccine: Marland Kitchen Difficulty breathing  . Swelling of face and throat  . A fast heartbeat  . A bad rash all over body  . Dizziness and weakness   Immunizations Administered    Name Date Dose VIS Date Route   Moderna COVID-19 Vaccine 10/29/2019  9:15 AM 0.5 mL 07/12/2019 Intramuscular   Manufacturer: Moderna   Lot: [id number]   [id number]

## 2019-11-30 ENCOUNTER — Ambulatory Visit: Payer: BC Managed Care – PPO | Attending: Internal Medicine

## 2019-11-30 DIAGNOSIS — Z23 Encounter for immunization: Secondary | ICD-10-CM

## 2019-11-30 NOTE — Progress Notes (Signed)
   Covid-19 Vaccination Clinic  Name:  Julie Cantrell    MRN: [id number] DOB: [birth year]  11/30/2019  Julie Cantrell was observed post Covid-19 immunization for 15 minutes without incident. She was provided with Vaccine Information Sheet and instruction to access the V-Safe system.   Julie Cantrell was instructed to call 911 with any severe reactions post vaccine: Marland Kitchen Difficulty breathing  . Swelling of face and throat  . A fast heartbeat  . A bad rash all over body  . Dizziness and weakness   Immunizations Administered    Name Date Dose VIS Date Route   Moderna COVID-19 Vaccine 11/30/2019  3:29 PM 0.5 mL 07/2019 Intramuscular   Manufacturer: Moderna   Lot: [id number]   [id number]

## 2020-02-09 DIAGNOSIS — L309 Dermatitis, unspecified: Secondary | ICD-10-CM | POA: Diagnosis not present

## 2020-03-02 ENCOUNTER — Encounter: Payer: Self-pay | Admitting: Nurse Practitioner

## 2020-03-02 ENCOUNTER — Other Ambulatory Visit: Payer: Self-pay

## 2020-03-02 ENCOUNTER — Ambulatory Visit: Payer: BC Managed Care – PPO | Admitting: Nurse Practitioner

## 2020-03-02 VITALS — BP 131/78 | HR 65 | Temp 98.2°F | Ht 68.0 in | Wt 139.0 lb

## 2020-03-02 DIAGNOSIS — M79673 Pain in unspecified foot: Secondary | ICD-10-CM

## 2020-03-02 DIAGNOSIS — R922 Inconclusive mammogram: Secondary | ICD-10-CM | POA: Diagnosis not present

## 2020-03-02 DIAGNOSIS — M79643 Pain in unspecified hand: Secondary | ICD-10-CM | POA: Diagnosis not present

## 2020-03-02 DIAGNOSIS — E785 Hyperlipidemia, unspecified: Secondary | ICD-10-CM | POA: Diagnosis not present

## 2020-03-02 DIAGNOSIS — E1169 Type 2 diabetes mellitus with other specified complication: Secondary | ICD-10-CM | POA: Diagnosis not present

## 2020-03-02 MED ORDER — MELOXICAM 15 MG PO TABS: 15.0000 mg | ORAL_TABLET | Freq: Every day | ORAL | 5 refills | Status: DC

## 2020-03-02 NOTE — Progress Notes (Signed)
Subjective:    Patient ID: Julie Cantrell, female    DOB: [birth year], 54 y.o.   MRN: [id number]   Chief Complaint: Arthritis   HPI Stiffness in her hands in the morning, sore to the touch in the joints. Pinky finger has a knot at the joint. Started after her COVID vaccine in March. Reports pain in her left foot with dorsiflexion and it is sore to the touch. Reports pain in the back of her knee on the left side, that foot hurts worse also. Reports stiffness is worse in the mornings and when she is sedentary, otherwise moving helps. Has not felt the need to take oral analgesics for pain. Believes the shower may help them start to loosen in the mornings.   * GYN did labs on her in [place] and LDL were up slightly- needs to have rechecked. The 10-year ASCVD risk score Mikey Bussing DC Jr., et al., 2013) is: 1.9%   Values used to calculate the score:     Age: 1 years     Sex: Female     Is Non-Hispanic African American: No     Diabetic: No     Tobacco smoker: No     Systolic Blood Pressure: 539 mmHg     Is BP treated: No     HDL Cholesterol: 57 mg/dL     Total Cholesterol: 206 mg/dL  * dense breast- they suggested she have a special MRI of breast that is only done at [facility] imaging  * posterior left knee  knee pain only when she has been sitting or standing for a long period of time. Pain rated 6-7/10 at times.  Review of Systems  Constitutional: Negative.   HENT: Negative.   Eyes: Negative.   Respiratory: Negative.   Cardiovascular: Negative.   Gastrointestinal: Negative.   Endocrine: Negative.   Genitourinary: Negative.   Musculoskeletal: Negative.   Skin: Negative.   Allergic/Immunologic: Negative.   Neurological: Negative.   Hematological: Negative.   Psychiatric/Behavioral: Negative.        Objective:   Physical Exam Vitals and nursing note reviewed.  Constitutional:      Appearance: Normal appearance. She is normal weight.  HENT:     Head: Normocephalic and  atraumatic.     Right Ear: Tympanic membrane, ear canal and external ear normal.     Left Ear: Tympanic membrane, ear canal and external ear normal.     Nose: Nose normal.     Mouth/Throat:     Mouth: Mucous membranes are moist.     Pharynx: Oropharynx is clear.  Eyes:     Extraocular Movements: Extraocular movements intact.     Conjunctiva/sclera: Conjunctivae normal.     Pupils: Pupils are equal, round, and reactive to light.  Cardiovascular:     Rate and Rhythm: Normal rate and regular rhythm.     Pulses: Normal pulses.     Heart sounds: Normal heart sounds.  Pulmonary:     Effort: Pulmonary effort is normal.     Breath sounds: Normal breath sounds.  Abdominal:     General: Abdomen is flat. Bowel sounds are normal.  Genitourinary:    General: Normal vulva.     Rectum: Normal.  Musculoskeletal:        General: Normal range of motion.     Cervical back: Normal range of motion and neck supple.  Skin:    General: Skin is warm and dry.     Capillary Refill: Capillary refill takes less than  2 seconds.  Neurological:     General: No focal deficit present.     Mental Status: She is alert and oriented to person, place, and time. Mental status is at baseline.  Psychiatric:        Mood and Affect: Mood normal.        Behavior: Behavior normal.        Thought Content: Thought content normal.        Judgment: Judgment normal.     BP (!) 131/78   Pulse 65   Temp 98.2 F (36.8 C) (Temporal)   Ht '5\' 8"'$  (1.727 m)   Wt 139 lb (63 kg)   LMP 09/27/2010   BMI 21.13 kg/m       Assessment & Plan:  Julie Cantrell in today with chief complaint of Arthritis (Stiffness in the AM, Right worse than left ), Elevated cholesterol, Foot Pain, and Leg Pain   1. Hand and foot pain, unspecified laterality Labs pending - Arthritis Panel - meloxicam (MOBIC) 15 MG tablet; Take 1 tablet (15 mg total) by mouth daily.  Dispense: 30 tablet; Refill: 5  2. Hyperlipidemia associated with type  2 diabetes mellitus ([place]) Low fat diet Labs pending - CMP14+EGFR - Lipid panel  3. Dense breasts Will find out about MRI for her breast at [facility]  4. Knee pain mobic should help.  The above assessment and management plan was discussed with the patient. The patient verbalized understanding of and has agreed to the management plan. Patient is aware to call the clinic if symptoms persist or worsen. Patient is aware when to return to the clinic for a follow-up visit. Patient educated on when it is appropriate to go to the emergency department.   Mary-Margaret Hassell Done, FNP

## 2020-03-02 NOTE — Patient Instructions (Signed)

## 2020-03-03 LAB — ARTHRITIS PANEL
Basophils Absolute: 0 10*3/uL (ref 0.0–0.2)
Basos: 0 %
EOS (ABSOLUTE): 0.1 10*3/uL (ref 0.0–0.4)
Eos: 3 %
Hematocrit: 37.2 % (ref 34.0–46.6)
Hemoglobin: 12.8 g/dL (ref 11.1–15.9)
Immature Grans (Abs): 0 10*3/uL (ref 0.0–0.1)
Immature Granulocytes: 0 %
Lymphocytes Absolute: 1.9 10*3/uL (ref 0.7–3.1)
Lymphs: 40 %
MCH: 30.6 pg (ref 26.6–33.0)
MCHC: 34.4 g/dL (ref 31.5–35.7)
MCV: 89 fL (ref 79–97)
Monocytes Absolute: 0.4 10*3/uL (ref 0.1–0.9)
Monocytes: 7 %
Neutrophils Absolute: 2.4 10*3/uL (ref 1.4–7.0)
Neutrophils: 50 %
Platelets: 195 10*3/uL (ref 150–450)
RBC: 4.18 x10E6/uL (ref 3.77–5.28)
RDW: 12.3 % (ref 11.7–15.4)
Rheumatoid fact SerPl-aCnc: <10 IU/mL (ref 0.0–13.9)
Sed Rate: 2 mm/hr (ref 0–40)
Uric Acid: 3.9 mg/dL (ref 3.0–7.2)
WBC: 4.8 10*3/uL (ref 3.4–10.8)

## 2020-03-03 LAB — CMP14+EGFR
ALT: 12 IU/L (ref 0–32)
AST: 18 IU/L (ref 0–40)
Albumin/Globulin Ratio: 2.1 (ref 1.2–2.2)
Albumin: 4.4 g/dL (ref 3.8–4.9)
Alkaline Phosphatase: 87 IU/L (ref 48–121)
BUN/Creatinine Ratio: 18 (ref 9–23)
BUN: 12 mg/dL (ref 6–24)
Bilirubin Total: 0.3 mg/dL (ref 0.0–1.2)
CO2: 25 mmol/L (ref 20–29)
Calcium: 9.1 mg/dL (ref 8.7–10.2)
Chloride: 106 mmol/L (ref 96–106)
Creatinine, Ser: 0.68 mg/dL (ref 0.57–1.00)
GFR calc Af Amer: 115 mL/min/{1.73_m2} (ref >59)
GFR calc non Af Amer: 99 mL/min/{1.73_m2} (ref >59)
Globulin, Total: 2.1 g/dL (ref 1.5–4.5)
Glucose: 70 mg/dL (ref 65–99)
Potassium: 3.9 mmol/L (ref 3.5–5.2)
Sodium: 141 mmol/L (ref 134–144)
Total Protein: 6.5 g/dL (ref 6.0–8.5)

## 2020-03-03 LAB — LIPID PANEL
Chol/HDL Ratio: 3.6 ratio (ref 0.0–4.4)
Cholesterol, Total: 208 mg/dL — ABNORMAL HIGH (ref 100–199)
HDL: 57 mg/dL (ref >39)
LDL Chol Calc (NIH): 133 mg/dL — ABNORMAL HIGH (ref 0–99)
Triglycerides: 101 mg/dL (ref 0–149)
VLDL Cholesterol Cal: 18 mg/dL (ref 5–40)

## 2020-03-05 ENCOUNTER — Other Ambulatory Visit: Payer: Self-pay | Admitting: Family Medicine

## 2020-03-05 ENCOUNTER — Other Ambulatory Visit: Payer: Self-pay

## 2020-03-05 DIAGNOSIS — R922 Inconclusive mammogram: Secondary | ICD-10-CM

## 2020-03-05 DIAGNOSIS — Z1239 Encounter for other screening for malignant neoplasm of breast: Secondary | ICD-10-CM

## 2020-03-06 ENCOUNTER — Telehealth: Payer: Self-pay | Admitting: Nurse Practitioner

## 2020-03-06 NOTE — Telephone Encounter (Signed)
Pt returned missed call from [facility] regarding lab results. Reviewed results with pt per MMMs notes. Pt voiced understanding and will work on getting cholesterol level down.

## 2020-03-21 ENCOUNTER — Ambulatory Visit: Payer: BC Managed Care – PPO | Admitting: Nurse Practitioner

## 2020-03-27 ENCOUNTER — Ambulatory Visit: Payer: BC Managed Care – PPO

## 2020-04-21 ENCOUNTER — Ambulatory Visit: Payer: BC Managed Care – PPO

## 2020-04-29 ENCOUNTER — Ambulatory Visit
Admission: RE | Admit: 2020-04-29 | Discharge: 2020-04-29 | Disposition: A | Discharge status: Home / Self Care | Payer: BC Managed Care – PPO | Source: Ambulatory Visit | Attending: Family Medicine | Admitting: Family Medicine

## 2020-04-29 ENCOUNTER — Other Ambulatory Visit: Payer: Self-pay

## 2020-04-29 DIAGNOSIS — Z1239 Encounter for other screening for malignant neoplasm of breast: Secondary | ICD-10-CM | POA: Diagnosis not present

## 2020-04-29 DIAGNOSIS — R922 Inconclusive mammogram: Secondary | ICD-10-CM

## 2020-04-29 MED ORDER — GADOBENATE DIMEGLUMINE 529 MG/ML IV SOLN
12.0000 mL | Freq: Once | INTRAVENOUS | Status: AC | PRN
  Administered 2020-04-29: 12 mL via INTRAVENOUS

## 2020-05-04 ENCOUNTER — Telehealth: Payer: Self-pay | Admitting: Nurse Practitioner

## 2020-05-04 DIAGNOSIS — F419 Anxiety disorder, unspecified: Secondary | ICD-10-CM

## 2020-05-04 NOTE — Telephone Encounter (Signed)
MR breast results are in chart please review and advise what to inform the patient

## 2020-05-14 MED ORDER — ALPRAZOLAM 0.25 MG PO TABS: ORAL_TABLET | ORAL | 2 refills | Status: DC

## 2020-05-14 NOTE — Telephone Encounter (Signed)
Patient called this week end stating that her husband got a bad CT report and his cancer is back. She would like a efill on her xanax until they can figure some things out. She very rarely takes but is completely out.

## 2020-06-19 ENCOUNTER — Ambulatory Visit: Payer: BC Managed Care – PPO

## 2020-06-28 ENCOUNTER — Ambulatory Visit (INDEPENDENT_AMBULATORY_CARE_PROVIDER_SITE_OTHER): Payer: BC Managed Care – PPO

## 2020-06-28 ENCOUNTER — Other Ambulatory Visit: Payer: Self-pay

## 2020-06-28 DIAGNOSIS — Z23 Encounter for immunization: Secondary | ICD-10-CM | POA: Diagnosis not present

## 2020-08-22 ENCOUNTER — Other Ambulatory Visit: Payer: Self-pay | Admitting: Obstetrics and Gynecology

## 2020-08-22 DIAGNOSIS — Z1231 Encounter for screening mammogram for malignant neoplasm of breast: Secondary | ICD-10-CM

## 2020-09-10 ENCOUNTER — Encounter: Payer: BC Managed Care – PPO | Admitting: Obstetrics and Gynecology

## 2020-09-12 ENCOUNTER — Encounter: Payer: Self-pay | Admitting: Obstetrics and Gynecology

## 2020-09-12 ENCOUNTER — Other Ambulatory Visit: Payer: Self-pay

## 2020-09-12 ENCOUNTER — Ambulatory Visit (INDEPENDENT_AMBULATORY_CARE_PROVIDER_SITE_OTHER): Payer: BC Managed Care – PPO | Admitting: Obstetrics and Gynecology

## 2020-09-12 VITALS — BP 138/82 | HR 84 | Ht 66.93 in | Wt 123.2 lb

## 2020-09-12 DIAGNOSIS — Z01419 Encounter for gynecological examination (general) (routine) without abnormal findings: Secondary | ICD-10-CM

## 2020-09-12 DIAGNOSIS — Z1272 Encounter for screening for malignant neoplasm of vagina: Secondary | ICD-10-CM | POA: Diagnosis not present

## 2020-09-12 DIAGNOSIS — E785 Hyperlipidemia, unspecified: Secondary | ICD-10-CM | POA: Diagnosis not present

## 2020-09-12 NOTE — Progress Notes (Signed)
Julie Cantrell [facility] September 14, 1965 [id number]  SUBJECTIVE:  55 y.o. G1P1001 female for annual routine gynecologic exam. She has no gynecologic concerns.  Recently lost her husband to what sounds to be metastatic pancreatic cancer.  Current Outpatient Medications  Medication Sig Dispense Refill  . ALPRAZolam (XANAX) 0.25 MG tablet TAKE ONE TABLET AT BEDTIME AS NEEDED 30 tablet 2  . Calcium Carb-Cholecalciferol (CALCIUM 1000 + D PO) Take by mouth.    . Cholecalciferol (VITAMIN D PO) Take by mouth.    . cyclobenzaprine (FLEXERIL) 10 MG tablet Take 10 mg by mouth as needed for muscle spasms.    . diphenhydrAMINE (BENADRYL) 25 MG tablet Take 25 mg by mouth as needed.    . meloxicam (MOBIC) 15 MG tablet Take 1 tablet (15 mg total) by mouth daily. 30 tablet 5  . Multiple Vitamin (MULTIVITAMIN) capsule Take 1 capsule by mouth daily.      . Omega-3 Fatty Acids (FISH OIL PO) Take by mouth.     No current facility-administered medications for this visit.   Allergies: Patient has no known allergies.  Patient's last menstrual period was 09/27/2010.  Past medical history,surgical history, problem list, medications, allergies, family history and social history were all reviewed and documented as reviewed in the EPIC chart.  ROS: Pertinent positives and negatives as reviewed in HPI  OBJECTIVE:  BP 138/82 (BP Location: Right Arm, Patient Position: Sitting)   Pulse 84   Ht 5' 6.93" (1.7 m)   Wt 123 lb 3.2 oz (55.9 kg)   LMP 09/27/2010   BMI 19.34 kg/m  The patient appears well, alert, oriented, appropriately distraught and tearful when discussing her husband ENT normal.  Neck supple. No cervical or supraclavicular adenopathy or thyromegaly.  Lungs are clear, good air entry, no wheezes, rhonchi or rales. S1 and S2 normal, no murmurs, regular rate and rhythm.  Abdomen soft without tenderness, guarding, mass or organomegaly.  Neurological is normal, no focal findings.  BREAST EXAM: breasts appear  normal, no suspicious masses, dense tissue, no skin or nipple changes or axillary nodes  PELVIC EXAM: VULVA: normal appearing vulva with atrophic change, no masses, tenderness or lesions, VAGINA: normal appearing vagina with atrophic change, normal color and discharge, no lesions, CERVIX: surgically absent, UTERUS: surgically absent, vaginal cuff normal, ADNEXA: no masses  Chaperone: Glorianne Manchester, RN present during the examination  ASSESSMENT:  55 y.o. G1P1001 here for annual gynecologic exam  PLAN:   1. Postmenopausal.  Prior TLH in 2012 for menorrhagia and adenomyosis.  No major vasomotor symptom concerns. 2. Pap smear 2019 was normal.  History of LGSIL and ASCUS 2006 and 2007.  Options to stop screening per current screening guidelines based on hysterectomy history versus less frequent screening intervals reviewed.  We went ahead and collected a cytology only Pap smear today from the vaginal cuff. 3. Mammogram scheduled 10/03/2020.  Left breast biopsy 09/2019 showed benign findings, stromal fibrosis with fibrocystic changes.  Breast exam normal today.  4. Colonoscopy 2020. Recommended that she continue per the prescribed interval.  5. Family history osteoporosis.  +Risk factors for osteoporosis.  Recommended getting DEXA in next few years prior to age 82.  64. Health maintenance. Orders placed for CBC, CMP, lipid profile.  Encouraged her to check on her blood pressure periodically at home, possibly a stress component given all that she is going through right now with her recent loss.  She is to report any persistently elevated systolic and/or diastolic values.  Managing episodic anxiety through her  primary doctor with occasional use of alprazolam, which they are managing.  Indicates she has a good support network with family and friends in the area.  The patient is aware that I will only be at this practice until early March 2022 so she knows to make sure she requests follow-up on results for any  medical tests that she does when I am no longer at the practice.   Return annually or sooner, prn.  Joseph Pierini MD  09/12/20

## 2020-09-13 DIAGNOSIS — M13842 Other specified arthritis, left hand: Secondary | ICD-10-CM | POA: Diagnosis not present

## 2020-09-13 DIAGNOSIS — M13841 Other specified arthritis, right hand: Secondary | ICD-10-CM | POA: Diagnosis not present

## 2020-09-13 LAB — COMPREHENSIVE METABOLIC PANEL
AG Ratio: 2.3 (calc) (ref 1.0–2.5)
ALT: 11 U/L (ref 6–29)
AST: 17 U/L (ref 10–35)
Albumin: 4.6 g/dL (ref 3.6–5.1)
Alkaline phosphatase (APISO): 72 U/L (ref 37–153)
BUN: 14 mg/dL (ref 7–25)
CO2: 27 mmol/L (ref 20–32)
Calcium: 9.4 mg/dL (ref 8.6–10.4)
Chloride: 102 mmol/L (ref 98–110)
Creat: 0.7 mg/dL (ref 0.50–1.05)
Globulin: 2 g/dL (calc) (ref 1.9–3.7)
Glucose, Bld: 79 mg/dL (ref 65–99)
Potassium: 3.6 mmol/L (ref 3.5–5.3)
Sodium: 136 mmol/L (ref 135–146)
Total Bilirubin: 0.8 mg/dL (ref 0.2–1.2)
Total Protein: 6.6 g/dL (ref 6.1–8.1)

## 2020-09-13 LAB — LIPID PANEL
Cholesterol: 216 mg/dL — ABNORMAL HIGH (ref <200)
HDL: 61 mg/dL (ref >=50)
LDL Cholesterol (Calc): 136 mg/dL (calc) — ABNORMAL HIGH
Non-HDL Cholesterol (Calc): 155 mg/dL (calc) — ABNORMAL HIGH (ref <130)
Total CHOL/HDL Ratio: 3.5 (calc) (ref <5.0)
Triglycerides: 87 mg/dL (ref <150)

## 2020-09-13 LAB — PAP IG W/ RFLX HPV ASCU

## 2020-09-13 LAB — CBC
HCT: 38.7 % (ref 35.0–45.0)
Hemoglobin: 13.1 g/dL (ref 11.7–15.5)
MCH: 30.5 pg (ref 27.0–33.0)
MCHC: 33.9 g/dL (ref 32.0–36.0)
MCV: 90 fL (ref 80.0–100.0)
MPV: 10.1 fL (ref 7.5–12.5)
Platelets: 198 10*3/uL (ref 140–400)
RBC: 4.3 10*6/uL (ref 3.80–5.10)
RDW: 11.9 % (ref 11.0–15.0)
WBC: 4.3 10*3/uL (ref 3.8–10.8)

## 2020-09-14 ENCOUNTER — Other Ambulatory Visit: Payer: Self-pay | Admitting: Nurse Practitioner

## 2020-09-14 DIAGNOSIS — F419 Anxiety disorder, unspecified: Secondary | ICD-10-CM

## 2020-09-25 ENCOUNTER — Encounter: Payer: Self-pay | Admitting: Nurse Practitioner

## 2020-10-03 ENCOUNTER — Ambulatory Visit: Payer: BC Managed Care – PPO | Admitting: Nurse Practitioner

## 2020-10-03 ENCOUNTER — Other Ambulatory Visit: Payer: Self-pay

## 2020-10-03 ENCOUNTER — Ambulatory Visit
Admission: RE | Admit: 2020-10-03 | Discharge: 2020-10-03 | Disposition: A | Discharge status: Home / Self Care | Payer: BC Managed Care – PPO | Source: Ambulatory Visit | Attending: Obstetrics and Gynecology | Admitting: Obstetrics and Gynecology

## 2020-10-03 DIAGNOSIS — Z1231 Encounter for screening mammogram for malignant neoplasm of breast: Secondary | ICD-10-CM

## 2020-10-04 ENCOUNTER — Ambulatory Visit: Payer: BC Managed Care – PPO | Admitting: Nurse Practitioner

## 2020-10-04 ENCOUNTER — Encounter: Payer: Self-pay | Admitting: Nurse Practitioner

## 2020-10-04 ENCOUNTER — Other Ambulatory Visit: Payer: Self-pay

## 2020-10-04 DIAGNOSIS — F419 Anxiety disorder, unspecified: Secondary | ICD-10-CM | POA: Diagnosis not present

## 2020-10-04 MED ORDER — ESCITALOPRAM OXALATE 10 MG PO TABS: 10.0000 mg | ORAL_TABLET | Freq: Every day | ORAL | 5 refills | Status: DC

## 2020-10-04 MED ORDER — ALPRAZOLAM 0.25 MG PO TABS: ORAL_TABLET | ORAL | 2 refills | Status: DC

## 2020-10-04 NOTE — Patient Instructions (Signed)
Managing Loss, Adult People experience loss in many different ways throughout their lives. Events such as moving, changing jobs, and losing friends can create a sense of loss. The loss may be as serious as a major health change, divorce, death of a pet, or death of a loved one. All of these types of loss are likely to create a physical and emotional reaction known as grief. Grief is the result of a major change or an absence of something or someone that you count on. Grief is a normal reaction to loss. A variety of factors can affect your grieving experience, including:  The nature of your loss.  Your relationship to what or whom you lost.  Your understanding of grief and how to manage it.  Your support system. How to manage lifestyle changes Keep to your normal routine as much as possible.  If you have trouble focusing or doing normal activities, it is acceptable to take some time away from your normal routine.  Spend time with friends and loved ones.  Eat a healthy diet, get plenty of sleep, and rest when you feel tired.   How to recognize changes  The way that you deal with your grief will affect your ability to function as you normally do. When grieving, you may experience these changes:  Numbness, shock, sadness, anxiety, anger, denial, and guilt.  Thoughts about death.  Unexpected crying.  A physical sensation of emptiness in your stomach.  Problems sleeping and eating.  Tiredness (fatigue).  Loss of interest in normal activities.  Dreaming about or imagining seeing the person who died.  A need to remember what or whom you lost.  Difficulty thinking about anything other than your loss for a period of time.  Relief. If you have been expecting the loss for a while, you may feel a sense of relief when it happens. Follow these instructions at home: Activity Express your feelings in healthy ways, such as:  Talking with others about your loss. It may be helpful to find  others who have had a similar loss, such as a support group.  Writing down your feelings in a journal.  Doing physical activities to release stress and emotional energy.  Doing creative activities like painting, sculpting, or playing or listening to music.  Practicing resilience. This is the ability to recover and adjust after facing challenges. Reading some resources that encourage resilience may help you to learn ways to practice those behaviors.   General instructions  Be patient with yourself and others. Allow the grieving process to happen, and remember that grieving takes time. ? It is likely that you may never feel completely done with some grief. You may find a way to move on while still cherishing memories and feelings about your loss. ? Accepting your loss is a process. It can take months or longer to adjust.  Keep all follow-up visits as told by your health care provider. This is important. Where to find support To get support for managing loss:  Ask your health care provider for help and recommendations, such as grief counseling or therapy.  Think about joining a support group for people who are managing a loss. Where to find more information You can find more information about managing loss from:  American Society of Clinical Oncology: www.cancer.net  American Psychological Association: www.apa.org Contact a health care provider if:  Your grief is extreme and keeps getting worse.  You have ongoing grief that does not improve.  Your body shows symptoms   of grief, such as illness.  You feel depressed, anxious, or lonely. Get help right away if:  You have thoughts about hurting yourself or others. If you ever feel like you may hurt yourself or others, or have thoughts about taking your own life, get help right away. You can go to your nearest emergency department or call:  Your local emergency services (911 in the [place]  A suicide crisis helpline, such as the  National Suicide Prevention Lifeline at 1-800-273-8255. This is open 24 hours a day. Summary  Grief is the result of a major change or an absence of someone or something that you count on. Grief is a normal reaction to loss.  The depth of grief and the period of recovery depend on the type of loss and your ability to adjust to the change and process your feelings.  Processing grief requires patience and a willingness to accept your feelings and talk about your loss with people who are supportive.  It is important to find resources that work for you and to realize that people experience grief differently. There is not one grieving process that works for everyone in the same way.  Be aware that when grief becomes extreme, it can lead to more severe issues like isolation, depression, anxiety, or suicidal thoughts. Talk with your health care provider if you have any of these issues. This information is not intended to replace advice given to you by your health care provider. Make sure you discuss any questions you have with your health care provider. Document Revised: 01/19/2020 Document Reviewed: 01/19/2020 Elsevier Patient Education  2021 Elsevier Inc.  

## 2020-10-04 NOTE — Progress Notes (Signed)
   Subjective:    Patient ID: Julie Cantrell, female    DOB: 05-16-1966, 55 y.o.   MRN: [id number]   Chief Complaint: Depression   HPI Patient comes in today for follow up of anxiety and depression. Her husband lost his battle with pancreatic cancer in December of 2021. She is still grieving. She has xanax 0.25 and has had to take more often lately. She says she just cant get back  To feeling like herself. GAD 7 : Generalized Anxiety Score 10/04/2020  Nervous, Anxious, on Edge 3  Control/stop worrying 1  Worry too much - different things 3  Trouble relaxing 3  Restless 0  Easily annoyed or irritable 0  Afraid - awful might happen 2  Total GAD 7 Score 12  Anxiety Difficulty Very difficult    Depression screen [facility] 2/9 10/04/2020 03/02/2020 08/23/2018  Decreased Interest 3 0 0  Down, Depressed, Hopeless 2 0 0  PHQ - 2 Score 5 0 0  Altered sleeping 3 - -  Tired, decreased energy 3 - -  Change in appetite 2 - -  Feeling bad or failure about yourself  0 - -  Trouble concentrating 3 - -  Moving slowly or fidgety/restless 0 - -  Suicidal thoughts 0 - -  PHQ-9 Score 16 - -  Difficult doing work/chores Very difficult - -     Review of Systems  Constitutional: Negative for diaphoresis.  Eyes: Negative for pain.  Respiratory: Negative for shortness of breath.   Cardiovascular: Negative for chest pain, palpitations and leg swelling.  Gastrointestinal: Negative for abdominal pain.  Endocrine: Negative for polydipsia.  Skin: Negative for rash.  Neurological: Negative for dizziness, weakness and headaches.  Hematological: Does not bruise/bleed easily.  All other systems reviewed and are negative.      Objective:   Physical Exam Vitals and nursing note reviewed.  Constitutional:      Appearance: Normal appearance.  Cardiovascular:     Rate and Rhythm: Normal rate and regular rhythm.     Heart sounds: Normal heart sounds.  Pulmonary:     Effort: Pulmonary effort is  normal.     Breath sounds: Normal breath sounds.  Skin:    General: Skin is warm.  Neurological:     General: No focal deficit present.     Mental Status: She is alert and oriented to person, place, and time.  Psychiatric:        Mood and Affect: Mood normal.        Behavior: Behavior normal.    BP 122/78   Pulse 70   Temp 98.1 F (36.7 C) (Temporal)   Ht 5' 6.93" (1.7 m)   Wt 121 lb 12.8 oz (55.2 kg)   LMP 09/27/2010   SpO2 96%   BMI 19.12 kg/m         Assessment & Plan:  Julie Cantrell comes in today with chief complaint of Depression   Diagnosis and orders addressed:  1. Anxiety Patient is going to contact someone at [facility] for grief counseling Keep a journal - escitalopram (LEXAPRO) 10 MG tablet; Take 1 tablet (10 mg total) by mouth daily.  Dispense: 30 tablet; Refill: 5 - ALPRAZolam (XANAX) 0.25 MG tablet; TAKE ONE TABLET AT BEDTIME AS NEEDED  Dispense: 30 tablet; Refill: 2   Labs pending Health Maintenance reviewed Diet and exercise encouraged  Follow up plan: 6 months and prn   Mary-Margaret Hassell Done, FNP

## 2020-10-10 DIAGNOSIS — L821 Other seborrheic keratosis: Secondary | ICD-10-CM | POA: Diagnosis not present

## 2021-01-05 ENCOUNTER — Other Ambulatory Visit: Payer: Self-pay | Admitting: Nurse Practitioner

## 2021-01-05 DIAGNOSIS — F419 Anxiety disorder, unspecified: Secondary | ICD-10-CM

## 2021-04-08 ENCOUNTER — Ambulatory Visit: Payer: BC Managed Care – PPO | Admitting: Nurse Practitioner

## 2021-04-08 ENCOUNTER — Encounter: Payer: Self-pay | Admitting: Nurse Practitioner

## 2021-04-08 ENCOUNTER — Telehealth: Payer: Self-pay | Admitting: Nurse Practitioner

## 2021-04-08 DIAGNOSIS — F419 Anxiety disorder, unspecified: Secondary | ICD-10-CM

## 2021-04-08 DIAGNOSIS — J0100 Acute maxillary sinusitis, unspecified: Secondary | ICD-10-CM | POA: Diagnosis not present

## 2021-04-08 MED ORDER — AMOXICILLIN-POT CLAVULANATE 875-125 MG PO TABS: 1.0000 | ORAL_TABLET | Freq: Two times a day (BID) | ORAL | 0 refills | Status: DC

## 2021-04-08 MED ORDER — MOLNUPIRAVIR EUA 200MG CAPSULE: 4.0000 | ORAL_CAPSULE | Freq: Two times a day (BID) | ORAL | 0 refills | Status: AC

## 2021-04-08 MED ORDER — ESCITALOPRAM OXALATE 10 MG PO TABS: 10.0000 mg | ORAL_TABLET | Freq: Every day | ORAL | 1 refills | Status: DC

## 2021-04-08 NOTE — Progress Notes (Addendum)
Virtual Visit  Note Due to COVID-19 pandemic this visit was conducted virtually. This visit type was conducted due to national recommendations for restrictions regarding the COVID-19 Pandemic (e.g. social distancing, sheltering in place) in an effort to limit this patient's exposure and mitigate transmission in our community. All issues noted in this document were discussed and addressed.  A physical exam was not performed with this format.  I connected with Julie Cantrell on 04/08/21 at 8:59 by telephone and verified that I am speaking with the correct person using two identifiers. Julie Cantrell is currently located at home and no one is currently with her during visit. The provider, Mary-Margaret Hassell Done, FNP is located in their office at time of visit.  I discussed the limitations, risks, security and privacy concerns of performing an evaluation and management service by telephone and the availability of in person appointments. I also discussed with the patient that there may be a patient responsible charge related to this service. The patient expressed understanding and agreed to proceed.   History and Present Illness:  HPI- patient calls in stating that she developed sore throat, nose burning, headache and teeth hurt. Has facial pressure. She had chill s this morning but no fever. Started yesterday. Has not done covid testing. Has occasional cough.    Review of Systems  Constitutional:  Positive for chills. Negative for fever.  HENT:  Positive for congestion and sore throat.   Respiratory:  Positive for cough. Negative for sputum production and shortness of breath.   Musculoskeletal:  Negative for myalgias.  Neurological:  Positive for headaches.    Observations/Objective: Alert and oriented- answers all questions appropriately No distress Cough- sounds dry Voice hoarse  Assessment and Plan: Julie Cantrell in today with chief complaint of No chief complaint on  file.   1. Acute non-recurrent maxillary sinusitis 1. Take meds as prescribed 2. Use a cool mist humidifier especially during the winter months and when heat has been humid. 3. Use saline nose sprays frequently 4. Saline irrigations of the nose can be very helpful if done frequently.  * 4X daily for 1 week*  * Use of a nettie pot can be helpful with this. Follow directions with this* 5. Drink plenty of fluids 6. Keep thermostat turn down low 7.For any cough or congestion  Use plain Mucinex- regular strength or max strength is fine   * Children- consult with Pharmacist for dosing 8. For fever or aces or pains- take tylenol or ibuprofen appropriate for age and weight.  * for fevers greater than 101 orally you may alternate ibuprofen and tylenol every  3 hours.   Meds ordered this encounter  Medications   amoxicillin-clavulanate (AUGMENTIN) 875-125 MG tablet    Sig: Take 1 tablet by mouth 2 (two) times daily.    Dispense:  14 tablet    Refill:  0    Order Specific Question:   Supervising Provider    Answer:   Caryl Pina A [id number]   Please do covid test      Follow Up Instructions: prn    I discussed the assessment and treatment plan with the patient. The patient was provided an opportunity to ask questions and all were answered. The patient agreed with the plan and demonstrated an understanding of the instructions.   The patient was advised to call back or seek an in-person evaluation if the symptoms worsen or if the condition fails to improve as anticipated.  The above assessment and  management plan was discussed with the patient. The patient verbalized understanding of and has agreed to the management plan. Patient is aware to call the clinic if symptoms persist or worsen. Patient is aware when to return to the clinic for a follow-up visit. Patient educated on when it is appropriate to go to the emergency department.   Time call ended:  9/12  I provided 13 minutes  of  non face-to-face time during this encounter.    Mary-Margaret Hassell Done, FNP  Addendum-  Patient did 2 covid test and were both positive. Will call in molnupivir. Hold antibiotic for now. Patient notified- allowed time for questions.  Mary-Margaret Hassell Done, FNP

## 2021-04-08 NOTE — Telephone Encounter (Signed)
Pt had televisit with MMM this morning. Was advised to be tested for covid. Pt called stating that she took 2 covid tests and both are positive.  Pt wants advise from MMM on next steps.

## 2021-04-08 NOTE — Addendum Note (Signed)
Addended by: Chevis Pretty on: 04/08/2021 02:16 PM   Modules accepted: Orders

## 2021-04-23 ENCOUNTER — Ambulatory Visit: Payer: BC Managed Care – PPO | Admitting: Nurse Practitioner

## 2021-04-23 ENCOUNTER — Encounter: Payer: Self-pay | Admitting: Nurse Practitioner

## 2021-04-23 ENCOUNTER — Other Ambulatory Visit: Payer: Self-pay

## 2021-04-23 VITALS — BP 107/60 | HR 60 | Temp 97.5°F | Resp 20 | Ht 66.0 in | Wt 123.0 lb

## 2021-04-23 DIAGNOSIS — R300 Dysuria: Secondary | ICD-10-CM | POA: Diagnosis not present

## 2021-04-23 LAB — MICROSCOPIC EXAMINATION
Renal Epithel, UA: NONE SEEN /hpf
WBC, UA: >30 /hpf — AB (ref 0–5)

## 2021-04-23 LAB — URINALYSIS, COMPLETE
Bilirubin, UA: NEGATIVE
Glucose, UA: NEGATIVE
Ketones, UA: NEGATIVE
Nitrite, UA: NEGATIVE
Protein,UA: NEGATIVE
Specific Gravity, UA: 1.01 (ref 1.005–1.030)
Urobilinogen, Ur: 0.2 mg/dL (ref 0.2–1.0)
pH, UA: 5.5 (ref 5.0–7.5)

## 2021-04-23 MED ORDER — NITROFURANTOIN MONOHYD MACRO 100 MG PO CAPS: 100.0000 mg | ORAL_CAPSULE | Freq: Two times a day (BID) | ORAL | 0 refills | Status: DC

## 2021-04-23 NOTE — Progress Notes (Signed)
   Subjective:    Patient ID: Julie Cantrell, female    DOB: May 20, 1966, 55 y.o.   MRN: [id number] \ Chief Complaint: Urinary Frequency   HPI Patient comes in today c/o dysuria, frequency and urgency. She denies fever and back pain.    Review of Systems  Constitutional:  Negative for diaphoresis.  Eyes:  Negative for pain.  Respiratory:  Negative for shortness of breath.   Cardiovascular:  Negative for chest pain, palpitations and leg swelling.  Gastrointestinal:  Negative for abdominal pain.  Endocrine: Negative for polydipsia.  Genitourinary:  Positive for dysuria, frequency and urgency. Negative for pelvic pain.  Skin:  Negative for rash.  Neurological:  Negative for dizziness, weakness and headaches.  Hematological:  Does not bruise/bleed easily.  All other systems reviewed and are negative.     Objective:   Physical Exam Vitals and nursing note reviewed.  Constitutional:      Appearance: Normal appearance.  Cardiovascular:     Rate and Rhythm: Normal rate and regular rhythm.     Heart sounds: Normal heart sounds.  Pulmonary:     Effort: Pulmonary effort is normal.     Breath sounds: Normal breath sounds.  Abdominal:     General: Abdomen is flat. Bowel sounds are normal.     Palpations: Abdomen is soft.  Skin:    General: Skin is warm.  Neurological:     General: No focal deficit present.     Mental Status: She is alert and oriented to person, place, and time.  Psychiatric:        Mood and Affect: Mood normal.        Behavior: Behavior normal.    BP 107/60   Pulse 60   Temp (!) 97.5 F (36.4 C) (Temporal)   Resp 20   Ht '5\' 6"'$  (1.676 m)   Wt 123 lb (55.8 kg)   LMP 09/27/2010   SpO2 97%   BMI 19.85 kg/m        Assessment & Plan:  Julie Cantrell in today with chief complaint of Urinary Frequency   1. Dysuria Take medication as prescribe Cotton underwear Take shower not bath Cranberry juice, yogurt Force fluids AZO over the  counter X2 days Culture pending RTO prn  - Urinalysis, Complete    The above assessment and management plan was discussed with the patient. The patient verbalized understanding of and has agreed to the management plan. Patient is aware to call the clinic if symptoms persist or worsen. Patient is aware when to return to the clinic for a follow-up visit. Patient educated on when it is appropriate to go to the emergency department.   Mary-Margaret Hassell Done, FNP

## 2021-05-03 ENCOUNTER — Telehealth: Payer: Self-pay | Admitting: Nurse Practitioner

## 2021-05-03 MED ORDER — CIPROFLOXACIN HCL 500 MG PO TABS: 500.0000 mg | ORAL_TABLET | Freq: Two times a day (BID) | ORAL | 0 refills | Status: DC

## 2021-05-03 NOTE — Telephone Encounter (Signed)
Sent in cipro

## 2021-05-03 NOTE — Telephone Encounter (Signed)
Meds ordered this encounter  Medications   ciprofloxacin (CIPRO) 500 MG tablet    Sig: Take 1 tablet (500 mg total) by mouth 2 (two) times daily.    Dispense:  10 tablet    Refill:  0    Order Specific Question:   Supervising Provider    Answer:   Caryl Pina A [id number]

## 2021-06-04 ENCOUNTER — Ambulatory Visit (INDEPENDENT_AMBULATORY_CARE_PROVIDER_SITE_OTHER): Payer: BC Managed Care – PPO

## 2021-06-04 ENCOUNTER — Other Ambulatory Visit: Payer: Self-pay

## 2021-06-04 DIAGNOSIS — Z23 Encounter for immunization: Secondary | ICD-10-CM

## 2021-06-05 ENCOUNTER — Ambulatory Visit: Payer: BC Managed Care – PPO

## 2021-08-14 ENCOUNTER — Other Ambulatory Visit: Payer: Self-pay | Admitting: Nurse Practitioner

## 2021-08-14 DIAGNOSIS — Z1231 Encounter for screening mammogram for malignant neoplasm of breast: Secondary | ICD-10-CM

## 2021-10-02 ENCOUNTER — Encounter: Payer: Self-pay | Admitting: Nurse Practitioner

## 2021-10-02 ENCOUNTER — Ambulatory Visit (INDEPENDENT_AMBULATORY_CARE_PROVIDER_SITE_OTHER): Payer: BC Managed Care – PPO | Admitting: Nurse Practitioner

## 2021-10-02 ENCOUNTER — Other Ambulatory Visit: Payer: Self-pay

## 2021-10-02 VITALS — BP 122/74 | Ht 67.0 in | Wt 126.0 lb

## 2021-10-02 DIAGNOSIS — E785 Hyperlipidemia, unspecified: Secondary | ICD-10-CM

## 2021-10-02 DIAGNOSIS — Z8262 Family history of osteoporosis: Secondary | ICD-10-CM

## 2021-10-02 DIAGNOSIS — N644 Mastodynia: Secondary | ICD-10-CM

## 2021-10-02 DIAGNOSIS — Z780 Asymptomatic menopausal state: Secondary | ICD-10-CM

## 2021-10-02 DIAGNOSIS — Z833 Family history of diabetes mellitus: Secondary | ICD-10-CM

## 2021-10-02 DIAGNOSIS — Z01419 Encounter for gynecological examination (general) (routine) without abnormal findings: Secondary | ICD-10-CM

## 2021-10-02 NOTE — Progress Notes (Signed)
[facility] 21-Jul-1966 [id number]   History:  56 y.o. G1P1001 presents for annual exam. Postmenopausal - no HRT. S/P 2012 TVH for adenomyosis and menorrhagia. 2006 LGSIL and 2007 ASCUS, paps normal since. She complains of left breast pain that is intermittent and a burning sensation. Denies nipple discharge, skin changes, redness, swelling, or mass. She has had benign breast biopsies with markers in the past in left breast.   Gynecologic History Patient's last menstrual period was 09/27/2010.   Contraception/Family planning: status post hysterectomy Sexually active: No  Health Maintenance Last Pap: 09/12/2020. Results were: Normal Last mammogram: 10/03/2020. Results were: Normal. Scheduled 10/07/2021 Last colonoscopy: 01/19/2019. Results were: Polyps, 7-year recall Last Dexa: Never  Past medical history, past surgical history, family history and social history were all reviewed and documented in the EPIC chart. Widowed. Husband deceased from pancreatic cancer. 51 yo daughter, married, ICU nurse for [employer]. Family history of osteoporosis.   ROS:  A ROS was performed and pertinent positives and negatives are included.  Exam:  Vitals:   10/02/21 1450  BP: 122/74  Weight: 126 lb (57.2 kg)  Height: 5\' 7"  (1.702 m)   Body mass index is 19.73 kg/m.  General appearance:  Normal Thyroid:  Symmetrical, normal in size, without palpable masses or nodularity. Respiratory  Auscultation:  Clear without wheezing or rhonchi Cardiovascular  Auscultation:  Regular rate, without rubs, murmurs or gallops  Edema/varicosities:  Not grossly evident Abdominal  Soft,nontender, without masses, guarding or rebound.  Liver/spleen:  No organomegaly noted  Hernia:  None appreciated  Skin  Inspection:  Grossly normal Breasts: Examined lying and sitting.   Right: Without masses, retractions, nipple discharge or axillary adenopathy.   Left: Without masses, retractions, nipple discharge or axillary  adenopathy. Genitourinary   Inguinal/mons:  Normal without inguinal adenopathy  External genitalia:  Normal appearing vulva with no masses, tenderness, or lesions  BUS/Urethra/Skene's glands:  Normal  Vagina:  Normal appearing with normal color and discharge, no lesions  Cervix:  Absent  Uterus:  Absent  Adnexa/parametria:     Rt: Normal in size, without masses or tenderness.   Lt: Normal in size, without masses or tenderness.  Anus and perineum: Normal  Digital rectal exam: Normal sphincter tone without palpated masses or tenderness  Patient informed chaperone available to be present for breast and pelvic exam. Patient has requested no chaperone to be present. Patient has been advised what will be completed during breast and pelvic exam.   Assessment/Plan:  57 y.o. G1P1001 for annual exam.   Well female exam with routine gynecological exam - Plan: CBC with Differential/Platelet, Comprehensive metabolic panel. Education provided on SBEs, importance of preventative screenings, current guidelines, high calcium diet, regular exercise, and multivitamin daily.  Will return later for fasting labs.   Postmenopausal - Plan: DG Bone Density. No HRT. 2012 TVH for adenomyosis and menorrhagia.   Family history of osteoporosis - Plan: DG Bone Density. She thinks her mother has it because she has been on injections before. Will schedule baseline bone density now. Recommend Vitamin D + Calcium and regular exercise.   Hyperlipidemia, unspecified hyperlipidemia type - Plan: Lipid panel  Pain of left breast - She complains of left breast pain that is intermittent and a burning sensation. Denies nipple discharge, skin changes, redness, swelling, or mass. She has had benign breast biopsies with markers in the past in left breast. She is scheduled for screening mammogram 10/07/2021. Will call to change to diagnostic mammogram.   Screening for cervical  cancer - Normal Pap history.  Discussed option to stop  screenings per guidelines. She is unsure. We will reassess on an annual basis. If she wishes to continue she will be due next in 2025.   Screening for colon cancer - 2020 colonoscopy. Will repeat at 7-year interval per GI's recommendation.   Return in 1 year for annual.     Tamela Gammon DNP, 3:24 PM 10/02/2021

## 2021-10-03 ENCOUNTER — Telehealth: Payer: Self-pay | Admitting: *Deleted

## 2021-10-03 DIAGNOSIS — N644 Mastodynia: Secondary | ICD-10-CM

## 2021-10-03 NOTE — Telephone Encounter (Signed)
-----   Message from Julie Gammon, NP sent at 10/02/2021  3:25 PM EST ----- Regarding: Diagnostic mammogram She is scheduled for screening mammogram 10/07/2021 at [facility]. She has been having left breast pain. Please call to get this changed to diagnostic. Thank you.

## 2021-10-03 NOTE — Telephone Encounter (Signed)
Orders placed at the breast center, mammogram canceled on 10/07/21. Patient now scheduled on 10/23/21 @ 10:50am  for the bilateral diag mammogram and left breast ultrasound.    Left message for patient to call.

## 2021-10-03 NOTE — Telephone Encounter (Signed)
Patient aware of date and time. Aware she can call daily/weekly to check for cancellations.

## 2021-10-07 ENCOUNTER — Ambulatory Visit: Payer: BC Managed Care – PPO

## 2021-10-09 ENCOUNTER — Ambulatory Visit (INDEPENDENT_AMBULATORY_CARE_PROVIDER_SITE_OTHER): Payer: BC Managed Care – PPO

## 2021-10-09 ENCOUNTER — Other Ambulatory Visit: Payer: Self-pay | Admitting: Nurse Practitioner

## 2021-10-09 ENCOUNTER — Other Ambulatory Visit: Payer: Self-pay

## 2021-10-09 ENCOUNTER — Other Ambulatory Visit: Payer: BC Managed Care – PPO

## 2021-10-09 DIAGNOSIS — E785 Hyperlipidemia, unspecified: Secondary | ICD-10-CM | POA: Diagnosis not present

## 2021-10-09 DIAGNOSIS — Z833 Family history of diabetes mellitus: Secondary | ICD-10-CM | POA: Diagnosis not present

## 2021-10-09 DIAGNOSIS — M810 Age-related osteoporosis without current pathological fracture: Secondary | ICD-10-CM | POA: Diagnosis not present

## 2021-10-09 DIAGNOSIS — Z01419 Encounter for gynecological examination (general) (routine) without abnormal findings: Secondary | ICD-10-CM | POA: Diagnosis not present

## 2021-10-09 DIAGNOSIS — Z780 Asymptomatic menopausal state: Secondary | ICD-10-CM

## 2021-10-09 DIAGNOSIS — Z8262 Family history of osteoporosis: Secondary | ICD-10-CM

## 2021-10-10 LAB — CBC WITH DIFFERENTIAL/PLATELET
Absolute Monocytes: 332 cells/uL (ref 200–950)
Basophils Absolute: 20 cells/uL (ref 0–200)
Basophils Relative: 0.5 %
Eosinophils Absolute: 39 cells/uL (ref 15–500)
Eosinophils Relative: 1 %
HCT: 38.2 % (ref 35.0–45.0)
Hemoglobin: 12.8 g/dL (ref 11.7–15.5)
Lymphs Abs: 1490 cells/uL (ref 850–3900)
MCH: 30.5 pg (ref 27.0–33.0)
MCHC: 33.5 g/dL (ref 32.0–36.0)
MCV: 91.2 fL (ref 80.0–100.0)
MPV: 9.7 fL (ref 7.5–12.5)
Monocytes Relative: 8.5 %
Neutro Abs: 2020 cells/uL (ref 1500–7800)
Neutrophils Relative %: 51.8 %
Platelets: 197 10*3/uL (ref 140–400)
RBC: 4.19 10*6/uL (ref 3.80–5.10)
RDW: 11.8 % (ref 11.0–15.0)
Total Lymphocyte: 38.2 %
WBC: 3.9 10*3/uL (ref 3.8–10.8)

## 2021-10-10 LAB — LIPID PANEL
Cholesterol: 193 mg/dL (ref <200)
HDL: 66 mg/dL (ref >=50)
LDL Cholesterol (Calc): 110 mg/dL (calc) — ABNORMAL HIGH
Non-HDL Cholesterol (Calc): 127 mg/dL (calc) (ref <130)
Total CHOL/HDL Ratio: 2.9 (calc) (ref <5.0)
Triglycerides: 83 mg/dL (ref <150)

## 2021-10-10 LAB — HEMOGLOBIN A1C
Hgb A1c MFr Bld: 5.3 % of total Hgb (ref <5.7)
Mean Plasma Glucose: 105 mg/dL
eAG (mmol/L): 5.8 mmol/L

## 2021-10-10 LAB — COMPREHENSIVE METABOLIC PANEL
AG Ratio: 2.3 (calc) (ref 1.0–2.5)
ALT: 14 U/L (ref 6–29)
AST: 19 U/L (ref 10–35)
Albumin: 4.4 g/dL (ref 3.6–5.1)
Alkaline phosphatase (APISO): 58 U/L (ref 37–153)
BUN: 12 mg/dL (ref 7–25)
CO2: 27 mmol/L (ref 20–32)
Calcium: 9.5 mg/dL (ref 8.6–10.4)
Chloride: 108 mmol/L (ref 98–110)
Creat: 0.78 mg/dL (ref 0.50–1.03)
Globulin: 1.9 g/dL (calc) (ref 1.9–3.7)
Glucose, Bld: 96 mg/dL (ref 65–99)
Potassium: 4.9 mmol/L (ref 3.5–5.3)
Sodium: 140 mmol/L (ref 135–146)
Total Bilirubin: 0.6 mg/dL (ref 0.2–1.2)
Total Protein: 6.3 g/dL (ref 6.1–8.1)

## 2021-10-23 ENCOUNTER — Ambulatory Visit: Payer: BC Managed Care – PPO

## 2021-10-23 ENCOUNTER — Ambulatory Visit
Admission: RE | Admit: 2021-10-23 | Discharge: 2021-10-23 | Disposition: A | Discharge status: Home / Self Care | Payer: BC Managed Care – PPO | Source: Ambulatory Visit | Attending: Nurse Practitioner | Admitting: Nurse Practitioner

## 2021-10-23 DIAGNOSIS — N644 Mastodynia: Secondary | ICD-10-CM

## 2021-10-23 DIAGNOSIS — R922 Inconclusive mammogram: Secondary | ICD-10-CM | POA: Diagnosis not present

## 2021-10-28 ENCOUNTER — Ambulatory Visit: Payer: BC Managed Care – PPO | Admitting: Family

## 2021-10-28 ENCOUNTER — Encounter: Payer: Self-pay | Admitting: Family

## 2021-10-28 ENCOUNTER — Telehealth: Payer: BC Managed Care – PPO | Admitting: Family Medicine

## 2021-10-28 DIAGNOSIS — F488 Other specified nonpsychotic mental disorders: Secondary | ICD-10-CM | POA: Diagnosis not present

## 2021-10-28 DIAGNOSIS — A084 Viral intestinal infection, unspecified: Secondary | ICD-10-CM

## 2021-10-28 DIAGNOSIS — K529 Noninfective gastroenteritis and colitis, unspecified: Secondary | ICD-10-CM | POA: Diagnosis not present

## 2021-10-28 DIAGNOSIS — E860 Dehydration: Secondary | ICD-10-CM | POA: Diagnosis not present

## 2021-10-28 DIAGNOSIS — R110 Nausea: Secondary | ICD-10-CM | POA: Diagnosis not present

## 2021-10-28 MED ORDER — ONDANSETRON HCL 4 MG PO TABS: 4.0000 mg | ORAL_TABLET | Freq: Three times a day (TID) | ORAL | 0 refills | Status: DC | PRN

## 2021-10-28 NOTE — Patient Instructions (Signed)
Viral Gastroenteritis, Adult ?Viral gastroenteritis is also known as the stomach flu. This condition may affect your stomach, small intestine, and large intestine. It can cause sudden watery diarrhea, fever, and vomiting. This condition is caused by many different viruses. These viruses can be passed from person to person very easily (are contagious). ?Diarrhea and vomiting can make you feel weak and cause you to become dehydrated. You may not be able to keep fluids down. Dehydration can make you tired and thirsty, cause you to have a dry mouth, and decrease how often you urinate. It is important to replace the fluids that you lose from diarrhea and vomiting. ?What are the causes? ?Gastroenteritis is caused by many viruses, including rotavirus and norovirus. Norovirus is the most common cause in adults. You can get sick after being exposed to the viruses from other people. You can also get sick by: ?Eating food, drinking water, or touching a surface contaminated with one of these viruses. ?Sharing utensils or other personal items with an infected person. ?What increases the risk? ?You are more likely to develop this condition if you: ?Have a weak body defense system (immune system). ?Live with one or more children who are younger than 61 years old. ?Live in a nursing home. ?Travel on cruise ships. ?What are the signs or symptoms? ?Symptoms of this condition start suddenly 1-3 days after exposure to a virus. Symptoms may last for a few days or for as long as a week. Common symptoms include watery diarrhea and vomiting. Other symptoms include: ?Fever. ?Headache. ?Fatigue. ?Pain in the abdomen. ?Chills. ?Weakness. ?Nausea. ?Muscle aches. ?Loss of appetite. ?How is this diagnosed? ?This condition is diagnosed with a medical history and physical exam. You may also have a stool test to check for viruses or other infections. ?How is this treated? ?This condition typically goes away on its own. The focus of treatment is to  prevent dehydration and restore lost fluids (rehydration). This condition may be treated with: ?An oral rehydration solution (ORS) to replace important salts and minerals (electrolytes) in your body. Take this if told by your health care provider. This is a drink that is sold at pharmacies and retail stores. ?Medicines to help with your symptoms. ?Probiotic supplements to reduce symptoms of diarrhea. ?Fluids given through an IV, if dehydration is severe. ?Older adults and people with other diseases or a weak immune system are at higher risk for dehydration. ?Follow these instructions at home: ?Eating and drinking ? ?Take an ORS as told by your health care provider. ?Drink clear fluids in small amounts as you are able. Clear fluids include: ?Water. ?Ice chips. ?Diluted fruit juice. ?Low-calorie sports drinks. ?Drink enough fluid to keep your urine pale yellow. ?Eat small amounts of healthy foods every 3-4 hours as you are able. This may include whole grains, fruits, vegetables, lean meats, and yogurt. ?Avoid fluids that contain a lot of sugar or caffeine, such as energy drinks, sports drinks, and soda. ?Avoid spicy or fatty foods. ?Avoid alcohol. ?General instructions ?Wash your hands often, especially after having diarrhea or vomiting. If soap and water are not available, use hand sanitizer. ?Make sure that all people in your household wash their hands well and often. ?Take over-the-counter and prescription medicines only as told by your health care provider. ?Rest at home while you recover. ?Watch your condition for any changes. ?Take a warm bath to relieve any burning or pain from frequent diarrhea episodes. ?Keep all follow-up visits as told by your health care provider. This  is important. ?Contact a health care provider if you: ?Cannot keep fluids down. ?Have symptoms that get worse. ?Have new symptoms. ?Feel light-headed or dizzy. ?Have muscle cramps. ?Get help right away if you: ?Have chest pain. ?Feel  extremely weak or you faint. ?See blood in your vomit. ?Have vomit that looks like coffee grounds. ?Have bloody or black stools or stools that look like tar. ?Have a severe headache, a stiff neck, or both. ?Have a rash. ?Have severe pain, cramping, or bloating in your abdomen. ?Have trouble breathing or you are breathing very quickly. ?Have a fast heartbeat. ?Have skin that feels cold and clammy. ?Feel confused. ?Have pain when you urinate. ?Have signs of dehydration, such as: ?Dark urine, very little urine, or no urine. ?Cracked lips. ?Dry mouth. ?Sunken eyes. ?Sleepiness. ?Weakness. ?Summary ?Viral gastroenteritis is also known as the stomach flu. It can cause sudden watery diarrhea, fever, and vomiting. ?This condition can be passed from person to person very easily (is contagious). ?Take an ORS if told by your health care provider. This is a drink that is sold at pharmacies and retail stores. ?Wash your hands often, especially after having diarrhea or vomiting. If soap and water are not available, use hand sanitizer. ?This information is not intended to replace advice given to you by your health care provider. Make sure you discuss any questions you have with your health care provider. ?Document Revised: 01/14/2019 Document Reviewed: 06/02/2018 ?Elsevier Patient Education ? [place]. ? ?

## 2021-10-28 NOTE — Progress Notes (Signed)
? ?Virtual Visit  Note ?Due to COVID-19 pandemic this visit was conducted virtually. This visit type was conducted due to national recommendations for restrictions regarding the COVID-19 Pandemic (e.g. social distancing, sheltering in place) in an effort to limit this patient's exposure and mitigate transmission in our community. All issues noted in this document were discussed and addressed.  A physical exam was not performed with this format. ? ?I connected with Julie Cantrell on 10/28/21 at 12:58 pm  by telephone and verified that I am speaking with the correct person using two identifiers. Julie Cantrell is currently located at home and no one is currently with her during visit. The provider, Evelina Dun, FNP is located in their office at time of visit. ? ?I discussed the limitations, risks, security and privacy concerns of performing an evaluation and management service by telephone and the availability of in person appointments. I also discussed with the patient that there may be a patient responsible charge related to this service. The patient expressed understanding and agreed to proceed. ? ?Julie Cantrell,you are scheduled for a virtual visit with your provider today.   ? ?Just as we do with appointments in the office, we must obtain your consent to participate.  Your consent will be active for this visit and any virtual visit you may have with one of our providers in the next 365 days.   ? ?If you have a MyChart account, I can also send a copy of this consent to you electronically.  All virtual visits are billed to your insurance company just like a traditional visit in the office.  As this is a virtual visit, video technology does not allow for your provider to perform a traditional examination.  This may limit your provider's ability to fully assess your condition.  If your provider identifies any concerns that need to be evaluated in person or the need to arrange testing such as labs, EKG,  etc, we will make arrangements to do so.   ? ?Although advances in technology are sophisticated, we cannot ensure that it will always work on either your end or our end.  If the connection with a video visit is poor, we may have to switch to a telephone visit.  With either a video or telephone visit, we are not always able to ensure that we have a secure connection.   I need to obtain your verbal consent now.   Are you willing to proceed with your visit today?  ? ?Julie Cantrell has provided verbal consent on 10/28/2021 for a virtual visit (video or telephone). ? ? ?Evelina Dun, FNP ?10/28/2021  12:59 PM ? ? ?History and Present Illness: ? ?Pt calls the office today with emesis and abdominal pain that started last night.  ?Emesis  ?This is a new problem. The current episode started yesterday. The problem occurs less than 2 times per day. The problem has been unchanged. The emesis has an appearance of stomach contents. There has been no fever. Associated symptoms include diarrhea. Pertinent negatives include no chest pain, chills, coughing, fever or myalgias. She has tried diet change, bed rest and increased fluids for the symptoms. The treatment provided mild relief.  ? ? ?Review of Systems  ?Constitutional:  Negative for chills and fever.  ?Respiratory:  Negative for cough.   ?Cardiovascular:  Negative for chest pain.  ?Gastrointestinal:  Positive for diarrhea and vomiting.  ?Musculoskeletal:  Negative for myalgias.  ?All other systems reviewed and are negative. ? ? ?  Observations/Objective: ?No SOB or distress noted  ? ?Assessment and Plan: ?1. Viral gastroenteritis ?Force fluids  ?Bland diet  ?Zofran as needed ?Follow up if symptoms worsen or do not improve  ?- ondansetron (ZOFRAN) 4 MG tablet; Take 1 tablet (4 mg total) by mouth every 8 (eight) hours as needed for nausea or vomiting.  Dispense: 20 tablet; Refill: 0 ? ? ?  ?I discussed the assessment and treatment plan with the patient. The patient was  provided an opportunity to ask questions and all were answered. The patient agreed with the plan and demonstrated an understanding of the instructions. ?  ?The patient was advised to call back or seek an in-person evaluation if the symptoms worsen or if the condition fails to improve as anticipated. ? ?The above assessment and management plan was discussed with the patient. The patient verbalized understanding of and has agreed to the management plan. Patient is aware to call the clinic if symptoms persist or worsen. Patient is aware when to return to the clinic for a follow-up visit. Patient educated on when it is appropriate to go to the emergency department.  ? ?Time call ended: 1:09 pm   ? ?I provided 11 minutes of  non face-to-face time during this encounter. ? ? ? ?Evelina Dun, FNP ? ? ?

## 2021-10-30 ENCOUNTER — Other Ambulatory Visit: Payer: Self-pay | Admitting: Nurse Practitioner

## 2021-10-30 DIAGNOSIS — F419 Anxiety disorder, unspecified: Secondary | ICD-10-CM

## 2021-10-31 ENCOUNTER — Emergency Department (HOSPITAL_COMMUNITY)
Admission: EM | Admit: 2021-10-31 | Discharge: 2021-10-31 | Disposition: A | Discharge status: Home / Self Care | Payer: BC Managed Care – PPO | Attending: Emergency Medicine | Admitting: Emergency Medicine

## 2021-10-31 ENCOUNTER — Other Ambulatory Visit: Payer: Self-pay

## 2021-10-31 ENCOUNTER — Encounter (HOSPITAL_COMMUNITY): Payer: Self-pay | Admitting: Emergency Medicine

## 2021-10-31 DIAGNOSIS — Z20822 Contact with and (suspected) exposure to covid-19: Secondary | ICD-10-CM | POA: Diagnosis not present

## 2021-10-31 DIAGNOSIS — R112 Nausea with vomiting, unspecified: Secondary | ICD-10-CM | POA: Diagnosis not present

## 2021-10-31 DIAGNOSIS — R197 Diarrhea, unspecified: Secondary | ICD-10-CM | POA: Diagnosis not present

## 2021-10-31 DIAGNOSIS — R1030 Lower abdominal pain, unspecified: Secondary | ICD-10-CM | POA: Diagnosis not present

## 2021-10-31 DIAGNOSIS — R1084 Generalized abdominal pain: Secondary | ICD-10-CM | POA: Diagnosis not present

## 2021-10-31 LAB — CBC WITH DIFFERENTIAL/PLATELET
Abs Immature Granulocytes: 0.05 10*3/uL (ref 0.00–0.07)
Basophils Absolute: 0 10*3/uL (ref 0.0–0.1)
Basophils Relative: 1 %
Eosinophils Absolute: 0 10*3/uL (ref 0.0–0.5)
Eosinophils Relative: 0 %
HCT: 36.9 % (ref 36.0–46.0)
Hemoglobin: 13.2 g/dL (ref 12.0–15.0)
Immature Granulocytes: 1 %
Lymphocytes Relative: 14 %
Lymphs Abs: 0.8 10*3/uL (ref 0.7–4.0)
MCH: 31.1 pg (ref 26.0–34.0)
MCHC: 35.8 g/dL (ref 30.0–36.0)
MCV: 87 fL (ref 80.0–100.0)
Monocytes Absolute: 0.3 10*3/uL (ref 0.1–1.0)
Monocytes Relative: 6 %
Neutro Abs: 4.7 10*3/uL (ref 1.7–7.7)
Neutrophils Relative %: 78 %
Platelets: 190 10*3/uL (ref 150–400)
RBC: 4.24 MIL/uL (ref 3.87–5.11)
RDW: 11.8 % (ref 11.5–15.5)
WBC: 5.9 10*3/uL (ref 4.0–10.5)
nRBC: 0 % (ref 0.0–0.2)

## 2021-10-31 LAB — COMPREHENSIVE METABOLIC PANEL
ALT: 16 U/L (ref 0–44)
AST: 22 U/L (ref 15–41)
Albumin: 4.4 g/dL (ref 3.5–5.0)
Alkaline Phosphatase: 54 U/L (ref 38–126)
Anion gap: 15 (ref 5–15)
BUN: 15 mg/dL (ref 6–20)
CO2: 18 mmol/L — ABNORMAL LOW (ref 22–32)
Calcium: 9.3 mg/dL (ref 8.9–10.3)
Chloride: 105 mmol/L (ref 98–111)
Creatinine, Ser: 0.8 mg/dL (ref 0.44–1.00)
GFR, Estimated: >60 mL/min (ref >60)
Glucose, Bld: 114 mg/dL — ABNORMAL HIGH (ref 70–99)
Potassium: 3.4 mmol/L — ABNORMAL LOW (ref 3.5–5.1)
Sodium: 138 mmol/L (ref 135–145)
Total Bilirubin: 1.5 mg/dL — ABNORMAL HIGH (ref 0.3–1.2)
Total Protein: 7.1 g/dL (ref 6.5–8.1)

## 2021-10-31 LAB — RESP PANEL BY RT-PCR (FLU A&B, COVID) ARPGX2
Influenza A by PCR: NEGATIVE
Influenza B by PCR: NEGATIVE
SARS Coronavirus 2 by RT PCR: NEGATIVE

## 2021-10-31 LAB — URINALYSIS, ROUTINE W REFLEX MICROSCOPIC
Bacteria, UA: NONE SEEN
Bilirubin Urine: NEGATIVE
Glucose, UA: NEGATIVE mg/dL
Ketones, ur: 80 mg/dL — AB
Leukocytes,Ua: NEGATIVE
Nitrite: NEGATIVE
Protein, ur: 100 mg/dL — AB
Specific Gravity, Urine: 1.017 (ref 1.005–1.030)
pH: 5 (ref 5.0–8.0)

## 2021-10-31 LAB — LIPASE, BLOOD: Lipase: 30 U/L (ref 11–51)

## 2021-10-31 MED ORDER — SODIUM CHLORIDE 0.9 % IV BOLUS
500.0000 mL | Freq: Once | INTRAVENOUS | Status: AC
  Administered 2021-10-31: 500 mL via INTRAVENOUS

## 2021-10-31 MED ORDER — SODIUM CHLORIDE 0.9 % IV BOLUS
1000.0000 mL | Freq: Once | INTRAVENOUS | Status: AC
  Administered 2021-10-31: 1000 mL via INTRAVENOUS

## 2021-10-31 MED ORDER — DICYCLOMINE HCL 10 MG PO CAPS
10.0000 mg | ORAL_CAPSULE | Freq: Once | ORAL | Status: AC
  Administered 2021-10-31: 10 mg via ORAL
  Filled 2021-10-31: qty 1

## 2021-10-31 NOTE — Discharge Instructions (Addendum)
Please follow-up with your primary care provider for further evaluation peer return to emergency department for any other red flag symptoms to be discussed today.  Please stick to a bland diet moving forward until your symptoms and abdominal pain have resolved. ?

## 2021-10-31 NOTE — ED Triage Notes (Signed)
Pt arrives via EMS with intermittent mid abd pain since Monday. Pt went to UC and diagnosed with stomach virus. Endorses n/v/d on Monday.  ?

## 2021-10-31 NOTE — ED Notes (Signed)
Pt verbalized she had abd pain and diarrhea on Monday and went to Urgent care and received Bentyl and Zofran . Both have helped and pt has been eating drinking. Pt stated pain comes and goes and usually laying down resting makes the pain go away.  ?

## 2021-10-31 NOTE — ED Notes (Signed)
Pt requested food. PA aware and approved. Ginger ale and peanut butter crackers given to pt. Daughter aware PA will be in to discuss labs per request. ?

## 2021-10-31 NOTE — ED Provider Notes (Signed)
?[facility] ?Provider Note ? ? ?CSN: [id number] ?Arrival date & time: 10/31/21  5093 ? ?  ? ?History ?Chief Complaint  ?Patient presents with  ? Abdominal Pain  ? ? ?Julie Cantrell is a 56 y.o. female with history of depression and remote history of IBS who presents to the emergency department with a 4-day history of nausea, vomiting, diarrhea, and lower abdominal cramping.  Patient denies any sick contacts.  She was initially seen and evaluated by her PCP via telephone visit and was prescribed Zofran.  Pain got worse and she was then evaluated at urgent care on 10/28/2021 where she was given IV fluids and more Zofran and improved.  Patient states that cramping has been intermittent over the last week and this morning became unbearable while she was getting ready for work.  Nothing seems to make this worse.  She states that the Zofran does help with the vomiting and Bentyl does help for abdominal cramping.  She denies any urinary complaints or vaginal complaints.  No fever or chills.  No cough, nasal congestion, sore throat, chest pain, or shortness of breath. ? ?Of note, the daughter at the bedside states that patient is supposed to be taking Lexapro and has not been taking it in a couple of weeks and restarted it a few days ago. ? ? ?Abdominal Pain ? ?  ? ?Home Medications ?Prior to Admission medications   ?Medication Sig Start Date End Date Taking? Authorizing Provider  ?ALPRAZolam (XANAX) 0.25 MG tablet TAKE ONE TABLET AT BEDTIME AS NEEDED ?Patient taking differently: 0.25 mg at bedtime as needed for anxiety. 10/04/20  Yes Hassell Done, Mary-Margaret, FNP  ?dicyclomine (BENTYL) 20 MG tablet Take 20 mg by mouth every 6 (six) hours as needed for spasms. 10/28/21  Yes [provider]  ?escitalopram (LEXAPRO) 10 MG tablet Take 1 tablet (10 mg total) by mouth daily. (NEEDS TO BE SEEN BEFORE NEXT REFILL) 10/31/21  Yes Chevis Pretty, FNP  ?ondansetron (ZOFRAN) 4 MG tablet Take 1 tablet  (4 mg total) by mouth every 8 (eight) hours as needed for nausea or vomiting. 10/28/21  Yes Sharion Balloon, FNP  ?   ? ?Allergies    ?Patient has no known allergies.   ? ?Review of Systems   ?Review of Systems  ?Gastrointestinal:  Positive for abdominal pain.  ?All other systems reviewed and are negative. ? ?Physical Exam ?Updated Vital Signs ?BP 119/75   Pulse 74   Temp 98.5 ?F (36.9 ?C) (Oral)   Resp (!) 23   Ht '5\' 7"'$  (1.702 m)   Wt 56.7 kg   LMP 09/27/2010   SpO2 97%   BMI 19.58 kg/m?  ?Physical Exam ?Vitals and nursing note reviewed.  ?Constitutional:   ?   General: She is not in acute distress. ?   Appearance: Normal appearance.  ?HENT:  ?   Head: Normocephalic and atraumatic.  ?Eyes:  ?   General:     ?   Right eye: No discharge.     ?   Left eye: No discharge.  ?Cardiovascular:  ?   Comments: Regular rate and rhythm.  S1/S2 are distinct without any evidence of murmur, rubs, or gallops.  Radial pulses are 2+ bilaterally.  Dorsalis pedis pulses are 2+ bilaterally.  No evidence of pedal edema. ?Pulmonary:  ?   Comments: Clear to auscultation bilaterally.  Normal effort.  No respiratory distress.  No evidence of wheezes, rales, or rhonchi heard throughout. ?Abdominal:  ?  General: Abdomen is flat. Bowel sounds are normal. There is no distension.  ?   Tenderness: There is no abdominal tenderness. There is no guarding or rebound.  ?Musculoskeletal:     ?   General: Normal range of motion.  ?   Cervical back: Neck supple.  ?Skin: ?   General: Skin is warm and dry.  ?   Findings: No rash.  ?Neurological:  ?   General: No focal deficit present.  ?   Mental Status: She is alert.  ?Psychiatric:     ?   Mood and Affect: Mood normal.     ?   Behavior: Behavior normal.  ? ? ?ED Results / Procedures / Treatments   ?Labs ?(all labs ordered are listed, but only abnormal results are displayed) ?Labs Reviewed  ?COMPREHENSIVE METABOLIC PANEL - Abnormal; Notable for the following components:  ?    Result Value  ?  Potassium 3.4 (*)   ? CO2 18 (*)   ? Glucose, Bld 114 (*)   ? Total Bilirubin 1.5 (*)   ? All other components within normal limits  ?URINALYSIS, ROUTINE W REFLEX MICROSCOPIC - Abnormal; Notable for the following components:  ? Hgb urine dipstick SMALL (*)   ? Ketones, ur 80 (*)   ? Protein, ur 100 (*)   ? All other components within normal limits  ?RESP PANEL BY RT-PCR (FLU A&B, COVID) ARPGX2  ?CBC WITH DIFFERENTIAL/PLATELET  ?LIPASE, BLOOD  ? ? ?EKG ?None ? ?Radiology ?No results found. ? ?Procedures ?Procedures  ? ? ?Medications Ordered in ED ?Medications  ?sodium chloride 0.9 % bolus 1,000 mL (0 mLs Intravenous Stopped 10/31/21 1053)  ?dicyclomine (BENTYL) capsule 10 mg (10 mg Oral Given 10/31/21 0955)  ?sodium chloride 0.9 % bolus 500 mL (500 mLs Intravenous New Bag/Given 10/31/21 1202)  ? ? ?ED Course/ Medical Decision Making/ A&P ?Clinical Course as of 10/31/21 1259  ?Thu Oct 31, 2021  ?1038 On reevaluation, patient states that her abdominal pain has improved significantly.  Patient has had no nausea, vomiting, or diarrhea since being in department.  Liter of fluid has finished which she also notes improvement.  We will also run half a liter of fluid. [CF]  ?1214 On repeat evaluation, patient is tolerating p.o. fluids and foods without any difficulty.  She states her abdominal pain is since resolved.  Half a liter of fluid is currently running.  I discussed all of the results of the lab work at the bedside with patient and family.  They expressed full understanding.  I also discussed diet moving forward until her symptoms resolved. [CF]  ?  ?Clinical Course User Index ?[CF] Myna Bright M, PA-C  ? ?                        ?Medical Decision Making ?Amount and/or Complexity of Data Reviewed ?Labs: ordered. ? ?Risk ?Prescription drug management. ? ? ?This patient presents to the ED for concern of abdominal pain, this involves an extensive number of treatment options, and is a complaint that carries with it a  high risk of complications and morbidity.  The differential diagnosis includes gastroenteritis.  Patient does not have any specific focal tenderness have a low suspicion for PUD, appendicitis, diverticulitis, pancreatitis, hepatobiliary disease.  Does not clinically fit for mesenteric ischemia. ? ? ?Co morbidities that complicate the patient evaluation ? ?Past Medical History:  ?Diagnosis Date  ? Abdominal adhesions   ? Allergy   ? Anxiety   ?  ASCUS (atypical squamous cells of undetermined significance) on Pap smear 11/2005  ? NORMAL PAPS 11/07 AND 4/08  ? Hx of colonic polyps 01/25/2019  ? Hyperlipidemia   ? 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 Neuromuscular disorder ([place])   ? Pulse irregularity 10-2010  ? ? ? ? ?Additional history obtained: ? ?Additional history obtained from nursing note ?External records from outside source obtained and reviewed including urgent care visits which was mentioned in HPI ? ? ?Lab Tests: ? ?I Ordered, and personally interpreted labs.  The pertinent results include: CBC which is without evidence of leukocytosis or anemia.  CMP did show mild hypokalemia and elevated glucose but was otherwise normal.  Urinalysis does not show any signs of infection but does clinically correlate with his possible dehydration.  Lipase is negative.  Respiratory panel is negative. ? ? ?Imaging Studies ordered: ? ?None ? ? ?Cardiac Monitoring: ? ?The patient was maintained on a cardiac monitor.  I personally viewed and interpreted the cardiac monitored which showed an underlying rhythm of: Normal sinus rhythm ? ? ?Medicines ordered and prescription drug management: ? ?I ordered medication including Bentyl for abdominal cramping, fluids for dehydration ?Reevaluation of the patient after these medicines showed that the patient improved ?I have reviewed the patients home medicines and have made adjustments as needed ? ? ?Test Considered: ? ?CT abdomen pelvis with contrast.  Patient is  not having any significant tenderness to light and deep palpation despite complaining of lower abdominal pain.  I am less concerned for emergent abdominal pathology at this time. ? ? ?Critical Interventions: ? ?Fluids

## 2021-10-31 NOTE — ED Notes (Signed)
PA at bedside  assessing.  ?

## 2021-11-04 ENCOUNTER — Other Ambulatory Visit: Payer: Self-pay

## 2021-11-04 ENCOUNTER — Ambulatory Visit: Payer: BC Managed Care – PPO | Admitting: Obstetrics & Gynecology

## 2021-11-04 ENCOUNTER — Encounter: Payer: Self-pay | Admitting: Obstetrics & Gynecology

## 2021-11-04 VITALS — BP 124/80

## 2021-11-04 DIAGNOSIS — Z8262 Family history of osteoporosis: Secondary | ICD-10-CM | POA: Diagnosis not present

## 2021-11-04 DIAGNOSIS — Z780 Asymptomatic menopausal state: Secondary | ICD-10-CM

## 2021-11-04 DIAGNOSIS — M810 Age-related osteoporosis without current pathological fracture: Secondary | ICD-10-CM

## 2021-11-04 MED ORDER — ALENDRONATE SODIUM 70 MG PO TABS: 70.0000 mg | ORAL_TABLET | ORAL | 4 refills | Status: DC

## 2021-11-04 NOTE — Progress Notes (Signed)
? ? ?  Julie Cantrell [facility] [birth year] [id number] ? ? ?     56 y.o.  G1P1001  ? ?RP: Counseling and management of Osteoporosis ? ?HPI:  Postmenopausal - no HRT. S/P 2012 TVH for adenomyosis and menorrhagia. First BD 10/09/2021 showed Osteoporosis.  Good balance.  No current fracture. ? ? ?OB History  ?Gravida Para Term Preterm AB Living  ?'1 1 1     1  '$ ?SAB IAB Ectopic Multiple Live Births  ?           ?  ?# Outcome Date GA Lbr Len/2nd Weight Sex Delivery Anes PTL Lv  ?1 Term           ? ? ?Past medical history,surgical history, problem list, medications, allergies, family history and social history were all reviewed and documented in the EPIC chart. ? ? ?Directed ROS with pertinent positives and negatives documented in the history of present illness/assessment and plan. ? ?Exam: ? ?Vitals:  ? 11/04/21 1422  ?BP: 124/80  ? ?General appearance:  Normal ? ?Bone Density 10/09/2021:  Osteoporosis with T-Score of -2.5 at the Rt Femoral Neck.  Other sites with Osteopenia. ? ? ?Assessment/Plan:  56 y.o. G1P1001  ? ?1. Age-related osteoporosis without current pathological fracture ?Postmenopausal - no HRT. S/P 2012 TVH for adenomyosis and menorrhagia. First BD 10/09/2021 showed Osteoporosis.  Good balance.  No current fracture.  Given that patient is young with already Osteoporosis, decision to start on Bone treatment.  Counseling done on treatment for Osteoporosis.  Decision to start on Alendronate.  Usage, risks and benefits thoroughly reviewed.  The risks of GE reflux and Necrosis of the jaw explained.  Recommended to interrupt her Alendronate treatment around major dental work.  No CI to Alendronate.  Prescription sent to pharmacy.  Vit D supplement, Ca++ total of 1.5 g/d and regular weight bearing physical activities.  Will repeat a BD in 2 years. ? ?2. Postmenopausal ?Well on no HRT.  ? ?3. Family history of osteoporosis ? ?Other orders ?- alendronate (FOSAMAX) 70 MG tablet; Take 1 tablet (70 mg total) by mouth every 7 (seven)  days. Take with a full glass of water on an empty stomach.  ? ?Counseling on Osteoporosis, importance of balance, the risk of bone fracture, the life style and supplements, as well as the medical treatment benefits and risks, review of the documentation for 30 minutes. ?Princess Bruins MD, 2:37 PM 11/04/2021 ? ? ? ?  ?

## 2021-11-06 ENCOUNTER — Encounter: Payer: Self-pay | Admitting: Obstetrics & Gynecology

## 2021-11-07 DIAGNOSIS — D225 Melanocytic nevi of trunk: Secondary | ICD-10-CM | POA: Diagnosis not present

## 2021-11-07 DIAGNOSIS — D2261 Melanocytic nevi of right upper limb, including shoulder: Secondary | ICD-10-CM | POA: Diagnosis not present

## 2021-11-07 DIAGNOSIS — L814 Other melanin hyperpigmentation: Secondary | ICD-10-CM | POA: Diagnosis not present

## 2021-11-07 DIAGNOSIS — L820 Inflamed seborrheic keratosis: Secondary | ICD-10-CM | POA: Diagnosis not present

## 2021-11-07 DIAGNOSIS — L821 Other seborrheic keratosis: Secondary | ICD-10-CM | POA: Diagnosis not present

## 2021-12-11 ENCOUNTER — Other Ambulatory Visit: Payer: Self-pay | Admitting: Nurse Practitioner

## 2021-12-11 DIAGNOSIS — Z1231 Encounter for screening mammogram for malignant neoplasm of breast: Secondary | ICD-10-CM

## 2022-02-07 ENCOUNTER — Ambulatory Visit (INDEPENDENT_AMBULATORY_CARE_PROVIDER_SITE_OTHER): Payer: BC Managed Care – PPO | Admitting: Family

## 2022-02-07 ENCOUNTER — Encounter: Payer: Self-pay | Admitting: Family

## 2022-02-07 DIAGNOSIS — H109 Unspecified conjunctivitis: Secondary | ICD-10-CM

## 2022-02-07 MED ORDER — POLYMYXIN B-TRIMETHOPRIM 10000-0.1 UNIT/ML-% OP SOLN: 1.0000 [drp] | Freq: Four times a day (QID) | OPHTHALMIC | 0 refills | Status: DC

## 2022-02-07 NOTE — Progress Notes (Signed)
Virtual Visit  Note Due to COVID-19 pandemic this visit was conducted virtually. This visit type was conducted due to national recommendations for restrictions regarding the COVID-19 Pandemic (e.g. social distancing, sheltering in place) in an effort to limit this patient's exposure and mitigate transmission in our community. All issues noted in this document were discussed and addressed.  A physical exam was not performed with this format.  I connected with Julie Cantrell on 02/07/22 at 11:56 AM by telephone and verified that I am speaking with the correct person using two identifiers. Julie Cantrell is currently located at work and no one is currently with her during visit. The provider, Evelina Dun, FNP is located in their office at time of visit.  I discussed the limitations, risks, security and privacy concerns of performing an evaluation and management service by telephone and the availability of in person appointments. I also discussed with the patient that there may be a patient responsible charge related to this service. The patient expressed understanding and agreed to proceed.  Julie Cantrell, Julie Cantrell are scheduled for a virtual visit with your provider today.    Just as we do with appointments in the office, we must obtain your consent to participate.  Your consent will be active for this visit and any virtual visit you may have with one of our providers in the next 365 days.    If you have a MyChart account, I can also send a copy of this consent to you electronically.  All virtual visits are billed to your insurance company just like a traditional visit in the office.  As this is a virtual visit, video technology does not allow for your provider to perform a traditional examination.  This may limit your provider's ability to fully assess your condition.  If your provider identifies any concerns that need to be evaluated in person or the need to arrange testing such as labs, EKG,  etc, we will make arrangements to do so.    Although advances in technology are sophisticated, we cannot ensure that it will always work on either your end or our end.  If the connection with a video visit is poor, we may have to switch to a telephone visit.  With either a video or telephone visit, we are not always able to ensure that we have a secure connection.   I need to obtain your verbal consent now.   Are you willing to proceed with your visit today?   Julie Cantrell has provided verbal consent on 02/07/2022 for a virtual visit (video or telephone).   Evelina Dun, [place] 02/07/2022  11:58 AM    History and Present Illness:  Conjunctivitis  The current episode started 3 to 5 days ago. The onset was sudden. The problem occurs continuously. The problem has been unchanged. The problem is moderate. Associated symptoms include eye itching, photophobia, eye discharge, eye pain and eye redness. Pertinent negatives include no ear pain, no headaches, no hearing loss, no rhinorrhea and no sore throat. The right eye is affected.      Review of Systems  HENT:  Negative for ear pain, hearing loss, rhinorrhea and sore throat.   Eyes:  Positive for photophobia, pain, discharge, redness and itching.  Neurological:  Negative for headaches.     Observations/Objective: No SOB or distress noted   Assessment and Plan: 1. Bacterial conjunctivitis of right eye Avoid rubbing eyes Cool compresses  Good hand hygiene New pillow case in 3 days Follow  up if symptoms worsen or do not improve   - trimethoprim-polymyxin b (POLYTRIM) ophthalmic solution; Place 1 drop into the left eye every 6 (six) hours.  Dispense: 10 mL; Refill: 0     I discussed the assessment and treatment plan with the patient. The patient was provided an opportunity to ask questions and all were answered. The patient agreed with the plan and demonstrated an understanding of the instructions.   The patient was advised to  call back or seek an in-person evaluation if the symptoms worsen or if the condition fails to improve as anticipated.  The above assessment and management plan was discussed with the patient. The patient verbalized understanding of and has agreed to the management plan. Patient is aware to call the clinic if symptoms persist or worsen. Patient is aware when to return to the clinic for a follow-up visit. Patient educated on when it is appropriate to go to the emergency department.   Time call ended:  12:07 pm  I provided 11 minutes of  non face-to-face time during this encounter.    Evelina Dun, FNP

## 2022-07-10 ENCOUNTER — Ambulatory Visit: Payer: BC Managed Care – PPO

## 2022-10-07 ENCOUNTER — Ambulatory Visit (INDEPENDENT_AMBULATORY_CARE_PROVIDER_SITE_OTHER): Payer: BC Managed Care – PPO | Admitting: Nurse Practitioner

## 2022-10-07 ENCOUNTER — Encounter: Payer: Self-pay | Admitting: Nurse Practitioner

## 2022-10-07 VITALS — BP 118/80 | Ht 67.0 in | Wt 131.0 lb

## 2022-10-07 DIAGNOSIS — E785 Hyperlipidemia, unspecified: Secondary | ICD-10-CM | POA: Diagnosis not present

## 2022-10-07 DIAGNOSIS — M810 Age-related osteoporosis without current pathological fracture: Secondary | ICD-10-CM | POA: Diagnosis not present

## 2022-10-07 DIAGNOSIS — R92343 Mammographic extreme density, bilateral breasts: Secondary | ICD-10-CM

## 2022-10-07 DIAGNOSIS — Z01419 Encounter for gynecological examination (general) (routine) without abnormal findings: Secondary | ICD-10-CM

## 2022-10-07 DIAGNOSIS — Z780 Asymptomatic menopausal state: Secondary | ICD-10-CM | POA: Diagnosis not present

## 2022-10-07 DIAGNOSIS — N644 Mastodynia: Secondary | ICD-10-CM | POA: Diagnosis not present

## 2022-10-07 MED ORDER — ALENDRONATE SODIUM 70 MG PO TABS: 70.0000 mg | ORAL_TABLET | ORAL | 4 refills | Status: DC

## 2022-10-07 NOTE — Progress Notes (Signed)
Julie Cantrell [facility] [birth year] [id number]   History:  57 y.o. G1P1001 presents for annual exam. Postmenopausal - no HRT. S/P 2012 TVH for adenomyosis and menorrhagia. 2006 LGSIL and 2007 ASCUS, paps normal since. 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 Started on Fosamax March 2023.Still having left breast discomfort/odd sensations. Normal diagnostic mammogram 10/2021. Had breast MRI in the past due to extremely dense breast tissue. Would like one now.   Gynecologic History Patient's last menstrual period was 09/27/2010.   Contraception/Family planning: status post hysterectomy Sexually active: No  Health Maintenance Last Pap: 09/12/2020. Results were: Normal, 3-year repeat Last mammogram: 10/23/2021. Results were: Normal Last colonoscopy: 01/19/2019. Results were: Polyps, 7-year recall Last Dexa: T-score -2.5  Past medical history, past surgical history, family history and social history were all reviewed and documented in the EPIC chart. Widowed. Husband deceased from pancreatic cancer. 58 yo daughter, married, ICU nurse for [employer], son due in April. Family history of osteoporosis.   ROS:  A ROS was performed and pertinent positives and negatives are included.  Exam:  Vitals:   10/07/22 1614  BP: 118/80  Weight: 131 lb (59.4 kg)  Height: '5\' 7"'$  (1.702 m)    Body mass index is 20.52 kg/m.  General appearance:  Normal Thyroid:  Symmetrical, normal in size, without palpable masses or nodularity. Respiratory  Auscultation:  Clear without wheezing or rhonchi Cardiovascular  Auscultation:  Regular rate, without rubs, murmurs or gallops  Edema/varicosities:  Not grossly evident Abdominal  Soft,nontender, without masses, guarding or rebound.  Liver/spleen:  No organomegaly noted  Hernia:  None appreciated  Skin  Inspection:  Grossly normal Breasts: Examined lying and sitting.   Right: Without masses, retractions, nipple discharge or axillary  adenopathy.   Left: Without masses, retractions, nipple discharge or axillary adenopathy. Genitourinary   Inguinal/mons:  Normal without inguinal adenopathy  External genitalia:  Normal appearing vulva with no masses, tenderness, or lesions  BUS/Urethra/Skene's glands:  Normal  Vagina:  Normal appearing with normal color and discharge, no lesions  Cervix:  Absent  Uterus:  Absent  Adnexa/parametria:     Rt: Normal in size, without masses or tenderness.   Lt: Normal in size, without masses or tenderness.  Anus and perineum: Normal  Digital rectal exam: Deferred  Patient informed chaperone available to be present for breast and pelvic exam. Patient has requested no chaperone to be present. Patient has been advised what will be completed during breast and pelvic exam.   Assessment/Plan:  57 y.o. G1P1001 for annual exam.   Well female exam with routine gynecological exam - Plan: CBC with Differential/Platelet, Comprehensive metabolic panel. Education provided on SBEs, importance of preventative screenings, current guidelines, high calcium diet, regular exercise, and multivitamin daily.  Will return later for fasting labs.   Postmenopausal - No HRT. 2012 TVH for adenomyosis and menorrhagia.   Hyperlipidemia, unspecified hyperlipidemia type - Plan: Lipid panel  Age-related osteoporosis without current pathological fracture - Plan: alendronate (FOSAMAX) 70 MG tablet weekly. Tolerating well. Refills x 1 year provided. Will repeat DXA next year.   Extremely dense tissue of both breasts on mammography - Category D breast tissue. Breast MRI in 2021 following biopsy. Would like another one. Will send referral for abbreviated breast MRI.   Pain of left breast - ongoing for about a year. No change. Normal mammogram 10/2021. Normal exam today.   Screening for cervical cancer - Normal Pap history.  Discussed option to stop screenings per guidelines. She is unsure. We will reassess  on an annual basis.  If she wishes to continue she will be due next in 2025.   Screening for colon cancer - 2020 colonoscopy. Will repeat at 7-year interval per GI's recommendation.   Return in 1 year for annual.     Tamela Gammon DNP, 4:33 PM 10/07/2022

## 2022-10-08 ENCOUNTER — Other Ambulatory Visit: Payer: Self-pay | Admitting: Nurse Practitioner

## 2022-10-08 ENCOUNTER — Telehealth: Payer: Self-pay

## 2022-10-08 DIAGNOSIS — N644 Mastodynia: Secondary | ICD-10-CM

## 2022-10-08 DIAGNOSIS — R922 Inconclusive mammogram: Secondary | ICD-10-CM

## 2022-10-08 LAB — CBC WITH DIFFERENTIAL/PLATELET
Absolute Monocytes: 445 cells/uL (ref 200–950)
Basophils Absolute: 20 cells/uL (ref 0–200)
Basophils Relative: 0.4 %
Eosinophils Absolute: 100 cells/uL (ref 15–500)
Eosinophils Relative: 2 %
HCT: 38.8 % (ref 35.0–45.0)
Hemoglobin: 13.4 g/dL (ref 11.7–15.5)
Lymphs Abs: 2040 cells/uL (ref 850–3900)
MCH: 30.9 pg (ref 27.0–33.0)
MCHC: 34.5 g/dL (ref 32.0–36.0)
MCV: 89.4 fL (ref 80.0–100.0)
MPV: 9.9 fL (ref 7.5–12.5)
Monocytes Relative: 8.9 %
Neutro Abs: 2395 cells/uL (ref 1500–7800)
Neutrophils Relative %: 47.9 %
Platelets: 223 10*3/uL (ref 140–400)
RBC: 4.34 10*6/uL (ref 3.80–5.10)
RDW: 11.9 % (ref 11.0–15.0)
Total Lymphocyte: 40.8 %
WBC: 5 10*3/uL (ref 3.8–10.8)

## 2022-10-08 LAB — LIPID PANEL
Cholesterol: 209 mg/dL — ABNORMAL HIGH (ref <200)
HDL: 65 mg/dL (ref >=50)
LDL Cholesterol (Calc): 122 mg/dL (calc) — ABNORMAL HIGH
Non-HDL Cholesterol (Calc): 144 mg/dL (calc) — ABNORMAL HIGH (ref <130)
Total CHOL/HDL Ratio: 3.2 (calc) (ref <5.0)
Triglycerides: 112 mg/dL (ref <150)

## 2022-10-08 LAB — COMPREHENSIVE METABOLIC PANEL
AG Ratio: 2 (calc) (ref 1.0–2.5)
ALT: 14 U/L (ref 6–29)
AST: 18 U/L (ref 10–35)
Albumin: 4.5 g/dL (ref 3.6–5.1)
Alkaline phosphatase (APISO): 50 U/L (ref 37–153)
BUN: 17 mg/dL (ref 7–25)
CO2: 25 mmol/L (ref 20–32)
Calcium: 9.5 mg/dL (ref 8.6–10.4)
Chloride: 103 mmol/L (ref 98–110)
Creat: 0.76 mg/dL (ref 0.50–1.03)
Globulin: 2.2 g/dL (calc) (ref 1.9–3.7)
Glucose, Bld: 96 mg/dL (ref 65–99)
Potassium: 4.5 mmol/L (ref 3.5–5.3)
Sodium: 138 mmol/L (ref 135–146)
Total Bilirubin: 0.6 mg/dL (ref 0.2–1.2)
Total Protein: 6.7 g/dL (ref 6.1–8.1)

## 2022-10-08 NOTE — Telephone Encounter (Signed)
So, my question in all that was do you want to order a diagnostic mammogram and u/s for her?  She is scheduled for screening currently.

## 2022-10-08 NOTE — Telephone Encounter (Signed)
Julie Cantrell,  Patient is scheduled for her screening mammogram on 10/28/2022. They require she have this performed prior to MRI".  The scheduling note says "no sx, no problems".  My question is with diagnosis for the MRI being: extremely dense tissue of both breast and left breast pain.  Should patient not be scheduled for Diagnostic Breast Imaging instead of a screening mammogram in March?    I can still place order for abbreviated MRI and she can schedule it too after the mammogram. But I think when they see the diagnosis on the MRI they are going to require diagnostic mammo.

## 2022-10-08 NOTE — Telephone Encounter (Signed)
Orders placed for Diagnostic Bilateral Mammo and left breast u/s.  Also, placed order for abbreviated MRI but will have patient call to schedule that.  Patient is scheduled for diagnostic mammo and u/s on Weds, April 10 at 1:40pm. Screening mammo cancelled.  I spoke with patient advising her of date/time of diagnostic mammo and about Abbreviated MRI from info sheet we were provided. I did make her aware of $400.00 charge that has to be paid at time of service and that no insurance will be filed.  I provided her with phone number and prompts to call and schedule the MRI appt.

## 2022-10-08 NOTE — Telephone Encounter (Signed)
Abbreviated breast MRI Received: Gaynelle Adu, NP  P [facility] Triage Please send referral for abbreviated breast MRI for extremely dense tissue of both breast and left breast pain.

## 2022-10-08 NOTE — Telephone Encounter (Signed)
Yes, sorry. Diagnostic mammogram first.

## 2022-10-08 NOTE — Telephone Encounter (Signed)
Yes, it is fine to have screening mammogram first. She had requested to have MRI and push back mammogram but just let her know why diagnostic is recommended first.

## 2022-10-08 NOTE — Telephone Encounter (Signed)
Great, thank you!

## 2022-10-15 ENCOUNTER — Ambulatory Visit
Admission: RE | Admit: 2022-10-15 | Discharge: 2022-10-15 | Disposition: A | Discharge status: Home / Self Care | Payer: BC Managed Care – PPO | Source: Ambulatory Visit | Attending: Nurse Practitioner | Admitting: Nurse Practitioner

## 2022-10-15 ENCOUNTER — Ambulatory Visit: Payer: BC Managed Care – PPO

## 2022-10-15 ENCOUNTER — Other Ambulatory Visit: Payer: Self-pay | Admitting: Nurse Practitioner

## 2022-10-15 DIAGNOSIS — N644 Mastodynia: Secondary | ICD-10-CM | POA: Diagnosis not present

## 2022-10-15 DIAGNOSIS — R922 Inconclusive mammogram: Secondary | ICD-10-CM

## 2022-10-27 NOTE — Telephone Encounter (Signed)
Patient cancelled 11/19/22 Diagnostic Imaging appointment.

## 2022-10-28 ENCOUNTER — Ambulatory Visit: Payer: BC Managed Care – PPO

## 2022-11-19 ENCOUNTER — Other Ambulatory Visit: Payer: BC Managed Care – PPO

## 2022-12-17 NOTE — Telephone Encounter (Signed)
Patient had diagnostic mammogram on 10/15/22 neg.  She is scheduled for bilateral breast MRI 01/24/23.  Encounter routed to Tiffany, NP and closed.

## 2022-12-23 ENCOUNTER — Telehealth (INDEPENDENT_AMBULATORY_CARE_PROVIDER_SITE_OTHER): Payer: BC Managed Care – PPO | Admitting: Nurse Practitioner

## 2022-12-23 ENCOUNTER — Encounter: Payer: Self-pay | Admitting: Nurse Practitioner

## 2022-12-23 DIAGNOSIS — A084 Viral intestinal infection, unspecified: Secondary | ICD-10-CM | POA: Diagnosis not present

## 2022-12-23 MED ORDER — ONDANSETRON HCL 4 MG PO TABS: 4.0000 mg | ORAL_TABLET | Freq: Three times a day (TID) | ORAL | 0 refills | Status: DC | PRN

## 2022-12-23 NOTE — Patient Instructions (Signed)
Julie Cantrell, thank you for joining Julie Pierini, FNP for today's virtual visit.  While this provider is not your primary care provider (PCP), if your PCP is located in our provider database this encounter information will be shared with them immediately following your visit.   A [facility] MyChart account gives you access to today's visit and all your visits, tests, and labs performed at [facility] " click here if you don't have a [place] MyChart account or go to [email or web address]  Consent: (Patient) Julie Cantrell provided verbal consent for this virtual visit at the beginning of the encounter.  Current Medications:  Current Outpatient Medications:    ondansetron (ZOFRAN) 4 MG tablet, Take 1 tablet (4 mg total) by mouth every 8 (eight) hours as needed for nausea or vomiting., Disp: 20 tablet, Rfl: 0   alendronate (FOSAMAX) 70 MG tablet, Take 1 tablet (70 mg total) by mouth every 7 (seven) days. Take with a full glass of water on an empty stomach., Disp: 12 tablet, Rfl: 4   ALPRAZolam (XANAX) 0.25 MG tablet, TAKE ONE TABLET AT BEDTIME AS NEEDED (Patient taking differently: 0.25 mg at bedtime as needed for anxiety.), Disp: 30 tablet, Rfl: 2   escitalopram (LEXAPRO) 10 MG tablet, Take 1 tablet (10 mg total) by mouth daily. (NEEDS TO BE SEEN BEFORE NEXT REFILL), Disp: 30 tablet, Rfl: 0   trimethoprim-polymyxin b (POLYTRIM) ophthalmic solution, Place 1 drop into the left eye every 6 (six) hours., Disp: 10 mL, Rfl: 0   Medications ordered in this encounter:  Meds ordered this encounter  Medications   ondansetron (ZOFRAN) 4 MG tablet    Sig: Take 1 tablet (4 mg total) by mouth every 8 (eight) hours as needed for nausea or vomiting.    Dispense:  20 tablet    Refill:  0    Order Specific Question:   Supervising Provider    Answer:   Arville Care A [[id number]]     *If you need refills on other medications prior to your next appointment, please  contact your pharmacy*  Follow-Up: Call back or seek an in-person evaluation if the symptoms worsen or if the condition fails to improve as anticipated.  [facility] [phone]  Other Instructions First 24 Hours-Clear liquids  popsicles  Jello  gatorade  Sprite Second 24 hours-Add Full liquids ( Liquids you cant see through) Third 24 hours- Bland diet ( foods that are baked or broiled)  *avoiding fried foods and highly spiced foods* During these 3 days  Avoid milk, cheese, ice cream or any other dairy products  Avoid caffeine- REMEMBER Mt. Dew and Mello Yellow contain lots of caffeine You should eat and drink in  Frequent small volumes If no improvement in symptoms or worsen in 2-3 days should RETRUN TO OFFICE or go to ER!      If you have been instructed to have an in-person evaluation today at a local Urgent Care facility, please use the link below. It will take you to a list of all of our available [facility], including address, phone number and hours of operation. Please do not delay care.  [facility]  If you or a family member do not have a primary care provider, use the link below to schedule a visit and establish care. When you choose a [facility] primary care physician or advanced practice provider, you gain a long-term partner in health. Find a Primary Care Provider  Learn more  about [facility]'s in-office and virtual care options: [facility]

## 2022-12-23 NOTE — Progress Notes (Signed)
Virtual Visit Consent   Julie Cantrell, you are scheduled for a virtual visit with Mary-Margaret Daphine Deutscher, FNP, a [facility] provider, today.     Just as with appointments in the office, your consent must be obtained to participate.  Your consent will be active for this visit and any virtual visit you may have with one of our providers in the next 365 days.     If you have a MyChart account, a copy of this consent can be sent to you electronically.  All virtual visits are billed to your insurance company just like a traditional visit in the office.    As this is a virtual visit, video technology does not allow for your provider to perform a traditional examination.  This may limit your provider's ability to fully assess your condition.  If your provider identifies any concerns that need to be evaluated in person or the need to arrange testing (such as labs, EKG, etc.), we will make arrangements to do so.     Although advances in technology are sophisticated, we cannot ensure that it will always work on either your end or our end.  If the connection with a video visit is poor, the visit may have to be switched to a telephone visit.  With either a video or telephone visit, we are not always able to ensure that we have a secure connection.     I need to obtain your verbal consent now.   Are you willing to proceed with your visit today? YES   Jonnette Stockett has provided verbal consent on 12/23/2022 for a virtual visit (video or telephone).   Mary-Margaret Daphine Deutscher, FNP   Date: 12/23/2022 10:41 AM   Virtual Visit via Video Note   I, Mary-Margaret Daphine Deutscher, connected with Julie Cantrell ([id number], 04-12-1966) on 12/23/22 at  6:15 PM EDT by a video-enabled telemedicine application and verified that I am speaking with the correct person using two identifiers.  Location: Patient: Virtual Visit Location Patient: Home Provider: Virtual Visit Location Provider: Mobile   I discussed  the limitations of evaluation and management by telemedicine and the availability of in person appointments. The patient expressed understanding and agreed to proceed.    History of Present Illness: Julie Cantrell is a 57 y.o. who identifies as a female who was assigned female at birth, and is being seen today for nausea and vomitin.  HPI: Patient stats started vomiting last night. Vomited 4-5 times. Some diarrhea. No fever on abdominal pain.    ROS  Problems:  Patient Active Problem List   Diagnosis Date Noted   Hx of colonic polyps 01/25/2019   Anxiety    PVC's (premature ventricular contractions) 12/03/2010    Allergies: No Known Allergies Medications:  Current Outpatient Medications:    alendronate (FOSAMAX) 70 MG tablet, Take 1 tablet (70 mg total) by mouth every 7 (seven) days. Take with a full glass of water on an empty stomach., Disp: 12 tablet, Rfl: 4   ALPRAZolam (XANAX) 0.25 MG tablet, TAKE ONE TABLET AT BEDTIME AS NEEDED (Patient taking differently: 0.25 mg at bedtime as needed for anxiety.), Disp: 30 tablet, Rfl: 2   escitalopram (LEXAPRO) 10 MG tablet, Take 1 tablet (10 mg total) by mouth daily. (NEEDS TO BE SEEN BEFORE NEXT REFILL), Disp: 30 tablet, Rfl: 0   trimethoprim-polymyxin b (POLYTRIM) ophthalmic solution, Place 1 drop into the left eye every 6 (six) hours., Disp: 10 mL, Rfl: 0  Observations/Objective: Patient is well-developed,  well-nourished in no acute distress.  Resting comfortably  at home.  Head is normocephalic, atraumatic.  No labored breathing.  Speech is clear and coherent with logical content.  Patient is alert and oriented at baseline.    Assessment and Plan:  Jasmine December in today with chief complaint of No chief complaint on file.   1. Viral gastroenteritis First 24 Hours-Clear liquids  popsicles  Jello  gatorade  Sprite Second 24 hours-Add Full liquids ( Liquids you cant see through) Third 24 hours- Bland diet ( foods  that are baked or broiled)  *avoiding fried foods and highly spiced foods* During these 3 days  Avoid milk, cheese, ice cream or any other dairy products  Avoid caffeine- REMEMBER Mt. Dew and Mello Yellow contain lots of caffeine You should eat and drink in  Frequent small volumes If no improvement in symptoms or worsen in 2-3 days should RETRUN TO OFFICE or go to ER!   Meds ordered this encounter  Medications   ondansetron (ZOFRAN) 4 MG tablet    Sig: Take 1 tablet (4 mg total) by mouth every 8 (eight) hours as needed for nausea or vomiting.    Dispense:  20 tablet    Refill:  0    Order Specific Question:   Supervising Provider    Answer:   Arville Care A [[id number]]        Follow Up Instructions: I discussed the assessment and treatment plan with the patient. The patient was provided an opportunity to ask questions and all were answered. The patient agreed with the plan and demonstrated an understanding of the instructions.  A copy of instructions were sent to the patient via MyChart.  The patient was advised to call back or seek an in-person evaluation if the symptoms worsen or if the condition fails to improve as anticipated.  Time:  I spent 6 minutes with the patient via telehealth technology discussing the above problems/concerns.    Mary-Margaret Daphine Deutscher, FNP

## 2023-01-07 ENCOUNTER — Telehealth: Payer: Self-pay | Admitting: *Deleted

## 2023-01-07 NOTE — Telephone Encounter (Signed)
-----   Message from Olivia Mackie, NP sent at 12/31/2022  4:50 PM EDT ----- Regarding: RE: MRI Perr-to-peer required MRI not recommended. Normal diagnostic imaging completed. Patient had requested MRI and is aware this is not the recommendation and would have to pay.  ----- Message ----- From: Leda Min, RN Sent: 12/31/2022  11:34 AM EDT To: Camelia Phenes; Olivia Mackie, NP Subject: RE: MRI Perr-to-peer required                  Tiffany -has this been completed?    ----- Message ----- From: Camelia Phenes Sent: 12/29/2022  11:39 AM EDT To: Leda Min, RN; Olivia Mackie, NP Subject: MRI Perr-to-peer required                      Date: 12/29/22  [place] [phone] Order [id number]   Anticipated Determination Date:12/31/2022  Insurance: [id number]  CPT: 77049-MRI breast with & w/out contract w/CAD-bilateral DX: N64.4, R92.2, R92.30  Location: [facility] MRI DOS: 01/24/23  PA: Yes, prior authorization required. Request for Breast (MR Mammography) - MRI does not meet medical necessity criteria based on the information provided.Please review the Clinical Criteria information specific to this exam below.  The ordering provider can call [phone] for a peer-to-peer discussion with a [facility] physician reviewer.  Employee initials: MM

## 2023-01-07 NOTE — Telephone Encounter (Signed)
Call placed to patient, left detailed message, ok per dpr. Advised per TW. Advised if she plans to proceed with breast MRI as scheduled she would have to pay out of pocket. If you do not not plan to proceed, contact [facility] at [phone] to cancel. Return call to office if any additional questions.   Routing to provider for final review. . Will close encounter.

## 2023-01-24 ENCOUNTER — Other Ambulatory Visit: Payer: BC Managed Care – PPO

## 2023-03-24 DIAGNOSIS — B078 Other viral warts: Secondary | ICD-10-CM | POA: Diagnosis not present

## 2023-03-24 DIAGNOSIS — L821 Other seborrheic keratosis: Secondary | ICD-10-CM | POA: Diagnosis not present

## 2023-03-27 NOTE — Addendum Note (Signed)
Addended by: Leda Min on: 03/27/2023 12:17 PM   Modules accepted: Orders

## 2023-03-27 NOTE — Telephone Encounter (Signed)
Per review of EPIC, Breast MRI not scheduled to date.  MRI Ordered canceled.   Routing to provider FYI.

## 2023-04-15 ENCOUNTER — Telehealth (INDEPENDENT_AMBULATORY_CARE_PROVIDER_SITE_OTHER): Payer: BC Managed Care – PPO | Admitting: Family Medicine

## 2023-04-15 ENCOUNTER — Encounter: Payer: Self-pay | Admitting: Family Medicine

## 2023-04-15 DIAGNOSIS — U071 COVID-19: Secondary | ICD-10-CM | POA: Diagnosis not present

## 2023-04-15 MED ORDER — NIRMATRELVIR/RITONAVIR (PAXLOVID)TABLET: 3.0000 | ORAL_TABLET | Freq: Two times a day (BID) | ORAL | 0 refills | Status: AC

## 2023-04-15 MED ORDER — NOREL AD 4-10-325 MG PO TABS: 1.0000 | ORAL_TABLET | ORAL | 0 refills | Status: DC | PRN

## 2023-04-15 NOTE — Progress Notes (Signed)
Virtual Visit via Video Note  I connected with Julie Cantrell on 04/15/23 at  4:30 PM EDT by a video enabled telemedicine application and verified that I am speaking with the correct person using two identifiers.  Patient Location: Home Provider Location: Office/Clinic  I discussed the limitations, risks, security, and privacy concerns of performing an evaluation and management service by video and the availability of in person appointments. I also discussed with the patient that there may be a patient responsible charge related to this service. The patient expressed understanding and agreed to proceed.  Subjective: PCP: Bennie Pierini, FNP  Chief Complaint  Patient presents with   Covid Positive   HPI States that she started feeling poorly Sunday night  Monday was covid positive x 3 at home.  States that she called the office on Monday and was given OTC treatment options.  States that she has a severe headache that started on Sunday night. Endorses congestion that feels like a terrible sinus infection. Reports that she is draining yellow.  Is taking motrin and robitussin. Endorses cough, rhinorrhea, headache, and sinus pressure.  ROS: Per HPI  Current Outpatient Medications:    alendronate (FOSAMAX) 70 MG tablet, Take 1 tablet (70 mg total) by mouth every 7 (seven) days. Take with a full glass of water on an empty stomach., Disp: 12 tablet, Rfl: 4   ALPRAZolam (XANAX) 0.25 MG tablet, TAKE ONE TABLET AT BEDTIME AS NEEDED (Patient taking differently: 0.25 mg at bedtime as needed for anxiety.), Disp: 30 tablet, Rfl: 2   escitalopram (LEXAPRO) 10 MG tablet, Take 1 tablet (10 mg total) by mouth daily. (NEEDS TO BE SEEN BEFORE NEXT REFILL), Disp: 30 tablet, Rfl: 0   ondansetron (ZOFRAN) 4 MG tablet, Take 1 tablet (4 mg total) by mouth every 8 (eight) hours as needed for nausea or vomiting., Disp: 20 tablet, Rfl: 0   trimethoprim-polymyxin b (POLYTRIM) ophthalmic solution,  Place 1 drop into the left eye every 6 (six) hours., Disp: 10 mL, Rfl: 0  Observations/Objective: There were no vitals filed for this visit. Physical Exam Constitutional:      General: She is awake. She is not in acute distress.    Appearance: Normal appearance. She is well-developed and well-groomed. She is ill-appearing. She is not toxic-appearing or diaphoretic.  HENT:     Nose: Congestion and rhinorrhea present.  Pulmonary:     Effort: Pulmonary effort is normal. No tachypnea or bradypnea.     Comments: Cough present  Neurological:     General: No focal deficit present.     Mental Status: She is alert and oriented to person, place, and time.  Psychiatric:        Attention and Perception: Attention and perception normal.        Mood and Affect: Mood and affect normal.        Speech: Speech normal.        Behavior: Behavior normal. Behavior is cooperative.        Thought Content: Thought content normal.        Cognition and Memory: Cognition and memory normal.        Judgment: Judgment normal.    Assessment and Plan: Julie Cantrell was seen today for covid positive.  Diagnoses and all orders for this visit:  Positive self-administered antigen test for COVID-19 Medications as below. Discussed with patient to avoid Xanax while taking paxlovid. Discussed isolation and supportive measures to continue at home.  -     nirmatrelvir/ritonavir (  PAXLOVID) 20 x 150 MG & 10 x 100MG  TABS; Take 3 tablets by mouth 2 (two) times daily for 5 days. (Take nirmatrelvir 150 mg two tablets twice daily for 5 days and ritonavir 100 mg one tablet twice daily for 5 days) Patient GFR is >60 -     Chlorphen-PE-Acetaminophen (NOREL AD) 4-10-325 MG TABS; Take 1 tablet by mouth every 4 (four) hours as needed.   Appt time 04/15/2023 04:30 PM Call duration: 00:07:17  Follow Up Instructions: Return if symptoms worsen or fail to improve.   I discussed the assessment and treatment plan with the patient. The patient  was provided an opportunity to ask questions, and all were answered. The patient agreed with the plan and demonstrated an understanding of the instructions.   The patient was advised to call back or seek an in-person evaluation if the symptoms worsen or if the condition fails to improve as anticipated.  The above assessment and management plan was discussed with the patient. The patient verbalized understanding of and has agreed to the management plan.   Neale Burly, DNP-FNP [facility] [address] [place], [place] [address] [phone]

## 2023-06-30 ENCOUNTER — Encounter: Payer: Self-pay | Admitting: Family Medicine

## 2023-06-30 ENCOUNTER — Ambulatory Visit: Payer: BC Managed Care – PPO | Admitting: Family Medicine

## 2023-06-30 VITALS — BP 112/72 | HR 74 | Temp 98.5°F | Ht 67.0 in | Wt 134.0 lb

## 2023-06-30 DIAGNOSIS — J019 Acute sinusitis, unspecified: Secondary | ICD-10-CM | POA: Diagnosis not present

## 2023-06-30 DIAGNOSIS — B9689 Other specified bacterial agents as the cause of diseases classified elsewhere: Secondary | ICD-10-CM | POA: Diagnosis not present

## 2023-06-30 MED ORDER — FLUCONAZOLE 150 MG PO TABS: 150.0000 mg | ORAL_TABLET | Freq: Once | ORAL | 0 refills | Status: AC

## 2023-06-30 MED ORDER — BENZONATATE 100 MG PO CAPS: 100.0000 mg | ORAL_CAPSULE | Freq: Three times a day (TID) | ORAL | 0 refills | Status: DC | PRN

## 2023-06-30 MED ORDER — METHYLPREDNISOLONE ACETATE 40 MG/ML IJ SUSP
40.0000 mg | Freq: Once | INTRAMUSCULAR | Status: AC
  Administered 2023-06-30: 40 mg via INTRAMUSCULAR

## 2023-06-30 MED ORDER — AMOXICILLIN-POT CLAVULANATE 875-125 MG PO TABS: 1.0000 | ORAL_TABLET | Freq: Two times a day (BID) | ORAL | 0 refills | Status: DC

## 2023-06-30 NOTE — Patient Instructions (Signed)
1. Take meds as prescribed 2. Use a cool mist humidifier especially during the winter months and when heat has been humid. 3. Use saline nose sprays frequently.  4. Saline irrigations of the nose can be very helpful if done frequently. Use 4 times daily for one week. Can use Nettie Pot if able to tolerate.  5. Drink plenty of fluids 6. Keep thermostat turned down low. 7.For any cough or congestion: Use plain Mucinex- regular strength or max strength is fine. 8. For fever, aches or pains- take tylenol or ibuprofen appropriate for age and weight, for fevers greater than 101 orally you may alternate ibuprofen and tylenol every 3 hours. 9. Change out your toothbrush 10. Return to clinic if symptoms worsen or do not improve.

## 2023-06-30 NOTE — Progress Notes (Signed)
Subjective:  Patient ID: Julie Cantrell, female    DOB: 1966/01/05, 57 y.o.   MRN: [id number]  Patient Care Team: Bennie Pierini, FNP as PCP - General (Family Medicine) Olivia Mackie, NP as Nurse Practitioner (Gynecology)   Chief Complaint:  Sinus Problem (Possible sins infection/Yellow-greenish mucus/phlegm/Cough/Congestion/X 1 week)   HPI: Cloteal Dunnell is a 57 y.o. female presenting on 06/30/2023 for Sinus Problem (Possible sins infection/Yellow-greenish mucus/phlegm/Cough/Congestion/X 1 week) States that they took grandson recently for sinus infection and ear infection and was near daughter who is also sick.  States that it started over a week ago. Trying nyquil, dayquil, mucinex, not helping very much.  Cough, PND, dull headache and dull teeth pain. Denies fever but had chills on and off.  Productive with yellow/green phlegm. Denies chest pain and tightness.   Relevant past medical, surgical, family, and social history reviewed and updated as indicated.  Allergies and medications reviewed and updated. Data reviewed: Chart in Epic.   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   Neuromuscular disorder (HCC)    Pulse irregularity 10-2010    Past Surgical History:  Procedure Laterality Date   BREAST BIOPSY Left    KIDNEY SURGERY  1985   LAPAROSCOPIC ENDOMETRIOSIS FULGURATION     LAPAROSCOPIC TOTAL HYSTERECTOMY  08/2010   menorrhagia/adenomyosis   NASAL SINUS SURGERY  1995    Social History   Socioeconomic History   Marital status: Widowed    Spouse name: Not on file   Number of children: Not on file   Years of education: Not on file   Highest education level: Not on file   Occupational History   Occupation: CSR    Employer: [employer]  Tobacco Use   Smoking status: Never    Passive exposure: Never   Smokeless tobacco: Never  Vaping Use   Vaping status: Never Used  Substance and Sexual Activity   Alcohol use: Yes    Alcohol/week: 0.0 standard drinks of alcohol    Comment: social   Drug use: No   Sexual activity: Not Currently    Partners: Male    Birth control/protection: Surgical    Comment: HYSTERECTOMY-1st intercourse 57 yo-Fewer than 5 partners  Other Topics Concern   Not on file  Social History Narrative   Not on file   Social Determinants of Health   Financial Resource Strain: Not on file  Food Insecurity: Not on file  Transportation Needs: Not on file  Physical Activity: Not on file  Stress: Not on file  Social Connections: Not on file  Intimate Partner Violence: Not on file    Outpatient Encounter Medications as of 06/30/2023  Medication Sig   alendronate (FOSAMAX) 70 MG tablet Take 1 tablet (70 mg total) by mouth every 7 (seven) days. Take with a full glass of water on an empty stomach.   ALPRAZolam (XANAX) 0.25 MG tablet TAKE ONE TABLET AT BEDTIME AS NEEDED (Patient taking differently: 0.25 mg at bedtime as needed for anxiety.)   Chlorphen-PE-Acetaminophen (NOREL AD) 4-10-325 MG TABS Take 1 tablet by mouth every 4 (four) hours as needed.   escitalopram (LEXAPRO) 10 MG tablet Take 1 tablet (10 mg total)  by mouth daily. (NEEDS TO BE SEEN BEFORE NEXT REFILL)   ondansetron (ZOFRAN) 4 MG tablet Take 1 tablet (4 mg total) by mouth every 8 (eight) hours as needed for nausea or vomiting.   trimethoprim-polymyxin b (POLYTRIM) ophthalmic solution Place 1 drop into the left eye every 6 (six) hours.   No facility-administered encounter medications on file as of 06/30/2023.    No Known Allergies  Review of Systems As per HPI  Objective:  BP 112/72   Pulse 74   Temp 98.5 F (36.9 C)   Ht 5\' 7"  (1.702 m)   Wt 134 lb (60.8 kg)    LMP 09/27/2010   SpO2 95%   BMI 20.99 kg/m    Wt Readings from Last 3 Encounters:  06/30/23 134 lb (60.8 kg)  10/07/22 131 lb (59.4 kg)  10/31/21 125 lb (56.7 kg)    Physical Exam Constitutional:      General: She is awake. She is not in acute distress.    Appearance: Normal appearance. She is well-developed and well-groomed. She is not ill-appearing, toxic-appearing or diaphoretic.  HENT:     Right Ear: Ear canal and external ear normal. No drainage, swelling or tenderness. A middle ear effusion is present. There is no impacted cerumen. No foreign body. No mastoid tenderness. No PE tube. No hemotympanum. Tympanic membrane is not injected, scarred, perforated, erythematous, retracted or bulging.     Left Ear: Ear canal and external ear normal. No drainage, swelling or tenderness. A middle ear effusion is present. There is no impacted cerumen. No foreign body. No mastoid tenderness. No PE tube. No hemotympanum. Tympanic membrane is not injected, scarred, perforated, erythematous, retracted or bulging.     Nose: Congestion and rhinorrhea present. Rhinorrhea is clear.     Right Sinus: Maxillary sinus tenderness and frontal sinus tenderness present.     Left Sinus: Maxillary sinus tenderness and frontal sinus tenderness present.     Mouth/Throat:     Lips: Pink. No lesions.     Tonsils: No tonsillar exudate or tonsillar abscesses.     Comments: Cough  Cardiovascular:     Rate and Rhythm: Normal rate and regular rhythm.     Pulses: Normal pulses.          Radial pulses are 2+ on the right side and 2+ on the left side.       Posterior tibial pulses are 2+ on the right side and 2+ on the left side.     Heart sounds: Normal heart sounds. No murmur heard.    No gallop.  Pulmonary:     Effort: Pulmonary effort is normal. No respiratory distress.     Breath sounds: No stridor. Decreased breath sounds present. No wheezing.     Comments: Course lung sounds in bilateral lung fields   Musculoskeletal:     Cervical back: Full passive range of motion without pain and neck supple.     Right lower leg: No edema.     Left lower leg: No edema.  Lymphadenopathy:     Head:     Right side of head: No submental, submandibular, tonsillar, preauricular or posterior auricular adenopathy.     Left side of head: No submental, submandibular, tonsillar, preauricular or posterior auricular adenopathy.  Skin:    General: Skin is warm.     Capillary Refill: Capillary refill takes less than 2 seconds.     Coloration: Skin is pale.  Neurological:     General: No focal deficit present.  Mental Status: She is alert, oriented to person, place, and time and easily aroused. Mental status is at baseline.     GCS: GCS eye subscore is 4. GCS verbal subscore is 5. GCS motor subscore is 6.     Motor: No weakness.  Psychiatric:        Attention and Perception: Attention and perception normal.        Mood and Affect: Mood and affect normal.        Speech: Speech normal.        Behavior: Behavior normal. Behavior is cooperative.        Thought Content: Thought content normal. Thought content does not include homicidal or suicidal ideation. Thought content does not include homicidal or suicidal plan.        Cognition and Memory: Cognition and memory normal.        Judgment: Judgment normal.    Results for orders placed or performed in visit on 10/07/22  CBC with Differential/Platelet  Result Value Ref Range   WBC 5.0 3.8 - 10.8 Thousand/uL   RBC 4.34 3.80 - 5.10 Million/uL   Hemoglobin 13.4 11.7 - 15.5 g/dL   HCT 16.1 09.6 - 04.5 %   MCV 89.4 80.0 - 100.0 fL   MCH 30.9 27.0 - 33.0 pg   MCHC 34.5 32.0 - 36.0 g/dL   RDW 40.9 81.1 - 91.4 %   Platelets 223 140 - 400 Thousand/uL   MPV 9.9 7.5 - 12.5 fL   Neutro Abs 2,395 1,500 - 7,800 cells/uL   Lymphs Abs 2,040 850 - 3,900 cells/uL   Absolute Monocytes 445 200 - 950 cells/uL   Eosinophils Absolute 100 15 - 500 cells/uL   Basophils  Absolute 20 0 - 200 cells/uL   Neutrophils Relative % 47.9 %   Total Lymphocyte 40.8 %   Monocytes Relative 8.9 %   Eosinophils Relative 2.0 %   Basophils Relative 0.4 %  Comprehensive metabolic panel  Result Value Ref Range   Glucose, Bld 96 65 - 99 mg/dL   BUN 17 7 - 25 mg/dL   Creat 7.82 9.56 - 2.13 mg/dL   BUN/Creatinine Ratio SEE NOTE: 6 - 22 (calc)   Sodium 138 135 - 146 mmol/L   Potassium 4.5 3.5 - 5.3 mmol/L   Chloride 103 98 - 110 mmol/L   CO2 25 20 - 32 mmol/L   Calcium 9.5 8.6 - 10.4 mg/dL   Total Protein 6.7 6.1 - 8.1 g/dL   Albumin 4.5 3.6 - 5.1 g/dL   Globulin 2.2 1.9 - 3.7 g/dL (calc)   AG Ratio 2.0 1.0 - 2.5 (calc)   Total Bilirubin 0.6 0.2 - 1.2 mg/dL   Alkaline phosphatase (APISO) 50 37 - 153 U/L   AST 18 10 - 35 U/L   ALT 14 6 - 29 U/L  Lipid panel  Result Value Ref Range   Cholesterol 209 (H) <200 mg/dL   HDL 65 > OR = 50 mg/dL   Triglycerides 086 <578 mg/dL   LDL Cholesterol (Calc) 122 (H) mg/dL (calc)   Total CHOL/HDL Ratio 3.2 <5.0 (calc)   Non-HDL Cholesterol (Calc) 144 (H) <130 mg/dL (calc)       46/96/2952    3:03 PM 04/23/2021   10:15 AM 10/04/2020    4:25 PM 03/02/2020    2:23 PM 08/23/2018    8:37 AM  Depression screen PHQ 2/9  Decreased Interest 0 1 3 0 0  Down, Depressed, Hopeless 0 1 2 0 0  PHQ - 2 Score 0 2 5 0 0  Altered sleeping 0 2 3    Tired, decreased energy 0 2 3    Change in appetite 0 1 2    Feeling bad or failure about yourself  0 0 0    Trouble concentrating 0 1 3    Moving slowly or fidgety/restless 0 0 0    Suicidal thoughts 0 0 0    PHQ-9 Score 0 8 16    Difficult doing work/chores Not difficult at all Not difficult at all Very difficult         06/30/2023    3:03 PM 04/23/2021   10:15 AM 10/04/2020    4:27 PM  GAD 7 : Generalized Anxiety Score  Nervous, Anxious, on Edge 0 1 3  Control/stop worrying 0 2 1  Worry too much - different things 0 2 3  Trouble relaxing 0 0 3  Restless 0 0 0  Easily annoyed or  irritable 0 1 0  Afraid - awful might happen 0 2 2  Total GAD 7 Score 0 8 12  Anxiety Difficulty Not difficult at all Not difficult at all Very difficult    Pertinent labs & imaging results that were available during my care of the patient were reviewed by me and considered in my medical decision making.  Assessment & Plan:  Ireri was seen today for sinus problem.  Diagnoses and all orders for this visit:  Acute bacterial rhinosinusitis Will start medication as below. Provided diflucan for patient history of antibiotic related yeast infection. Provided injection as below as well to assist with symptom management.  -     amoxicillin-clavulanate (AUGMENTIN) 875-125 MG tablet; Take 1 tablet by mouth 2 (two) times daily for 7 days. -     benzonatate (TESSALON PERLES) 100 MG capsule; Take 1 capsule (100 mg total) by mouth 3 (three) times daily as needed for cough. -     fluconazole (DIFLUCAN) 150 MG tablet; Take 1 tablet (150 mg total) by mouth once for 1 dose. May take second dose 72 hours after first dose if symptoms remain - methylPREDNISolone acetate (DEPO-MEDROL) injection 40 mg  Continue all other maintenance medications.  Follow up plan: Return if symptoms worsen or fail to improve.   Continue healthy lifestyle choices, including diet (rich in fruits, vegetables, and lean proteins, and low in salt and simple carbohydrates) and exercise (at least 30 minutes of moderate physical activity daily).  Written and verbal instructions provided   The above assessment and management plan was discussed with the patient. The patient verbalized understanding of and has agreed to the management plan. Patient is aware to call the clinic if they develop any new symptoms or if symptoms persist or worsen. Patient is aware when to return to the clinic for a follow-up visit. Patient educated on when it is appropriate to go to the emergency department.   Neale Burly, DNP-FNP [facility] [address] [place], [place] [address] [phone]

## 2023-07-06 DIAGNOSIS — R0689 Other abnormalities of breathing: Secondary | ICD-10-CM | POA: Diagnosis not present

## 2023-07-06 DIAGNOSIS — R079 Chest pain, unspecified: Secondary | ICD-10-CM | POA: Diagnosis not present

## 2023-07-06 DIAGNOSIS — R110 Nausea: Secondary | ICD-10-CM | POA: Diagnosis not present

## 2023-07-06 DIAGNOSIS — R059 Cough, unspecified: Secondary | ICD-10-CM | POA: Diagnosis not present

## 2023-07-07 ENCOUNTER — Ambulatory Visit: Payer: BC Managed Care – PPO | Admitting: Family Medicine

## 2023-07-07 ENCOUNTER — Encounter: Payer: Self-pay | Admitting: Family Medicine

## 2023-07-07 ENCOUNTER — Ambulatory Visit (INDEPENDENT_AMBULATORY_CARE_PROVIDER_SITE_OTHER): Payer: BC Managed Care – PPO

## 2023-07-07 VITALS — BP 122/86 | HR 90 | Temp 97.2°F | Ht 67.0 in | Wt 131.2 lb

## 2023-07-07 DIAGNOSIS — J40 Bronchitis, not specified as acute or chronic: Secondary | ICD-10-CM | POA: Diagnosis not present

## 2023-07-07 DIAGNOSIS — J329 Chronic sinusitis, unspecified: Secondary | ICD-10-CM

## 2023-07-07 DIAGNOSIS — F419 Anxiety disorder, unspecified: Secondary | ICD-10-CM

## 2023-07-07 DIAGNOSIS — R059 Cough, unspecified: Secondary | ICD-10-CM | POA: Diagnosis not present

## 2023-07-07 DIAGNOSIS — R0600 Dyspnea, unspecified: Secondary | ICD-10-CM | POA: Diagnosis not present

## 2023-07-07 MED ORDER — BETAMETHASONE SOD PHOS & ACET 6 (3-3) MG/ML IJ SUSP
6.0000 mg | Freq: Once | INTRAMUSCULAR | Status: AC
  Administered 2023-07-07: 6 mg via INTRAMUSCULAR

## 2023-07-07 MED ORDER — ESCITALOPRAM OXALATE 10 MG PO TABS: 10.0000 mg | ORAL_TABLET | Freq: Every day | ORAL | 0 refills | Status: DC

## 2023-07-07 MED ORDER — MOXIFLOXACIN HCL 400 MG PO TABS: 400.0000 mg | ORAL_TABLET | Freq: Every day | ORAL | 0 refills | Status: DC

## 2023-07-07 MED ORDER — HYDROCODONE BIT-HOMATROP MBR 5-1.5 MG/5ML PO SOLN: 5.0000 mL | Freq: Four times a day (QID) | ORAL | 0 refills | Status: AC | PRN

## 2023-07-07 NOTE — Progress Notes (Addendum)
Chief Complaint  Patient presents with   Cough   Sinusitis    HPI  Patient presents today for Patient presents with upper respiratory congestion. Rhinorrhea that is frequently purulent. There is moderate sore throat. Patient reports coughing frequently as well.  Yellow & green sputum noted. There is no fever, chills or sweats. The patient denies being short of breath. Onset was 3 weeks ago. Gradually worsening. Seen last week, Dx was sinus infection. Finished Abx today. Still coughing, fatigued.   Brother in [place] passed away this morning. She had a panic reaction because it reminded her of losing her husband 3 years ago. Had right chest pain. Tight & sore. No radiation. EMS called. Did EKG which was normal .Pt. Needs refill of Lexapro to tide her over until she can see Gennette Pac.  PMH: Smoking status noted ROS: Per HPI  Objective: BP 122/86   Pulse 90   Temp (!) 97.2 F (36.2 C)   Ht 5\' 7"  (1.702 m)   Wt 131 lb 3.2 oz (59.5 kg)   LMP 09/27/2010   SpO2 96%   BMI 20.55 kg/m  Gen: NAD, alert, cooperative with exam HEENT: NCAT, Nasal passages swollen, red TMS RED CV: RRR, good S1/S2, no murmur Resp: Bronchitis changes with scattered wheezes, non-labored Ext: No edema, warm Neuro: Alert and oriented, No gross deficits  Assessment and plan:  1. Sinobronchitis   2. Anxiety     Meds ordered this encounter  Medications   moxifloxacin (AVELOX) 400 MG tablet    Sig: Take 1 tablet (400 mg total) by mouth daily.    Dispense:  10 tablet    Refill:  0   betamethasone acetate-betamethasone sodium phosphate (CELESTONE) injection 6 mg   HYDROcodone bit-homatropine (HYCODAN) 5-1.5 MG/5ML syrup    Sig: Take 5 mLs by mouth every 6 (six) hours as needed for up to 5 days for cough.    Dispense:  100 mL    Refill:  0   escitalopram (LEXAPRO) 10 MG tablet    Sig: Take 1 tablet (10 mg total) by mouth daily.    Dispense:  90 tablet    Refill:  0    Orders Placed This Encounter   Procedures   DG Chest 2 View    Standing Status:   Future    Standing Expiration Date:   08/06/2023    Order Specific Question:   Reason for Exam (SYMPTOM  OR DIAGNOSIS REQUIRED)    Answer:   cough, dyspnea, COPD    Order Specific Question:   Preferred imaging location?    Answer:   Internal    Follow up as needed.  Mechele Claude, MD

## 2023-07-07 NOTE — Addendum Note (Signed)
Addended by: Mechele Claude on: 07/07/2023 04:58 PM   Modules accepted: Orders

## 2023-07-08 NOTE — Progress Notes (Signed)
Your chest x-ray looked normal. Thanks, WS.

## 2023-07-23 ENCOUNTER — Telehealth (INDEPENDENT_AMBULATORY_CARE_PROVIDER_SITE_OTHER): Payer: BC Managed Care – PPO | Admitting: Nurse Practitioner

## 2023-07-23 ENCOUNTER — Encounter: Payer: Self-pay | Admitting: Nurse Practitioner

## 2023-07-23 DIAGNOSIS — J069 Acute upper respiratory infection, unspecified: Secondary | ICD-10-CM | POA: Diagnosis not present

## 2023-07-23 MED ORDER — FLUTICASONE PROPIONATE 50 MCG/ACT NA SUSP: 2.0000 | Freq: Every day | NASAL | 6 refills | Status: DC

## 2023-07-23 MED ORDER — PREDNISONE 20 MG PO TABS: 40.0000 mg | ORAL_TABLET | Freq: Every day | ORAL | 0 refills | Status: AC

## 2023-07-23 NOTE — Progress Notes (Signed)
Virtual Visit Consent   Julie Cantrell, you are scheduled for a virtual visit with Mary-Margaret Daphine Deutscher, FNP, a [facility] provider, today.     Just as with appointments in the office, your consent must be obtained to participate.  Your consent will be active for this visit and any virtual visit you may have with one of our providers in the next 365 days.     If you have a MyChart account, a copy of this consent can be sent to you electronically.  All virtual visits are billed to your insurance company just like a traditional visit in the office.    As this is a virtual visit, video technology does not allow for your provider to perform a traditional examination.  This may limit your provider's ability to fully assess your condition.  If your provider identifies any concerns that need to be evaluated in person or the need to arrange testing (such as labs, EKG, etc.), we will make arrangements to do so.     Although advances in technology are sophisticated, we cannot ensure that it will always work on either your end or our end.  If the connection with a video visit is poor, the visit may have to be switched to a telephone visit.  With either a video or telephone visit, we are not always able to ensure that we have a secure connection.     I need to obtain your verbal consent now.   Are you willing to proceed with your visit today? YES   Julie Cantrell has provided verbal consent on 07/23/2023 for a virtual visit (video or telephone).   Mary-Margaret Daphine Deutscher, FNP   Date: 07/23/2023 8:44 AM   Virtual Visit via Video Note   I, Mary-Margaret Daphine Deutscher, connected with Julie Cantrell ([id number], [birth year]) on 07/23/23 at  8:30 AM EST by a video-enabled telemedicine application and verified that I am speaking with the correct person using two identifiers.  Location: Patient: Virtual Visit Location Patient: Home Provider: Virtual Visit Location Provider: Mobile   I discussed  the limitations of evaluation and management by telemedicine and the availability of in person appointments. The patient expressed understanding and agreed to proceed.    History of Present Illness: Julie Cantrell is a 57 y.o. who identifies as a female who was assigned female at birth, and is being seen today for cough.  HPI: Patient was seen 06/30/23 and was given amoxicilin and steroids. She came back a week later and was no better. They changed her to avelox and hycodan. She is overall better but still has congestion and cough. Has been taking robitussin and sudafed.  URI  This is a new problem. The current episode started 1 to 4 weeks ago. The problem has been waxing and waning. There has been no fever. Associated symptoms include congestion, coughing and rhinorrhea. She has tried nothing for the symptoms. The treatment provided mild relief.    Review of Systems  HENT:  Positive for congestion and rhinorrhea.   Respiratory:  Positive for cough.     Problems:  Patient Active Problem List   Diagnosis Date Noted   Hx of colonic polyps 01/25/2019   Anxiety    PVC's (premature ventricular contractions) 12/03/2010    Allergies: No Known Allergies Medications:  Current Outpatient Medications:    alendronate (FOSAMAX) 70 MG tablet, Take 1 tablet (70 mg total) by mouth every 7 (seven) days. Take with a full glass of water on an  empty stomach., Disp: 12 tablet, Rfl: 4   ALPRAZolam (XANAX) 0.25 MG tablet, TAKE ONE TABLET AT BEDTIME AS NEEDED (Patient taking differently: 0.25 mg at bedtime as needed for anxiety.), Disp: 30 tablet, Rfl: 2   escitalopram (LEXAPRO) 10 MG tablet, Take 1 tablet (10 mg total) by mouth daily., Disp: 90 tablet, Rfl: 0   moxifloxacin (AVELOX) 400 MG tablet, Take 1 tablet (400 mg total) by mouth daily., Disp: 10 tablet, Rfl: 0   ondansetron (ZOFRAN) 4 MG tablet, Take 1 tablet (4 mg total) by mouth every 8 (eight) hours as needed for nausea or vomiting., Disp: 20  tablet, Rfl: 0   trimethoprim-polymyxin b (POLYTRIM) ophthalmic solution, Place 1 drop into the left eye every 6 (six) hours., Disp: 10 mL, Rfl: 0  Observations/Objective: Patient is well-developed, well-nourished in no acute distress.  Resting comfortably  at home.  Head is normocephalic, atraumatic.  No labored breathing.  Speech is clear and coherent with logical content.  Patient is alert and oriented at baseline.  Raspy voice Wet cough  Assessment and Plan:  Julie Cantrell in today with chief complaint of No chief complaint on file.   1. URI with cough and congestion (Primary) 1. Take meds as prescribed 2. Use a cool mist humidifier especially during the winter months and when heat has been humid. 3. Use saline nose sprays frequently 4. Saline irrigations of the nose can be very helpful if done frequently.  * 4X daily for 1 week*  * Use of a nettie pot can be helpful with this. Follow directions with this* 5. Drink plenty of fluids 6. Keep thermostat turn down low 7.For any cough or congestion- hycodan 8. For fever or aces or pains- take tylenol or ibuprofen appropriate for age and weight.  * for fevers greater than 101 orally you may alternate ibuprofen and tylenol every  3 hours.   Meds ordered this encounter  Medications   predniSONE (DELTASONE) 20 MG tablet    Sig: Take 2 tablets (40 mg total) by mouth daily with breakfast for 5 days. 2 po daily for 5 days    Dispense:  10 tablet    Refill:  0    Supervising Provider:   Arville Care A [[id number]]   fluticasone (FLONASE) 50 MCG/ACT nasal spray    Sig: Place 2 sprays into both nostrils daily.    Dispense:  16 g    Refill:  6    Supervising Provider:   Arville Care A [[id number]]        Follow Up Instructions: I discussed the assessment and treatment plan with the patient. The patient was provided an opportunity to ask questions and all were answered. The patient agreed with the plan and demonstrated  an understanding of the instructions.  A copy of instructions were sent to the patient via MyChart.  The patient was advised to call back or seek an in-person evaluation if the symptoms worsen or if the condition fails to improve as anticipated.  Time:  I spent 9 minutes with the patient via telehealth technology discussing the above problems/concerns.    Mary-Margaret Daphine Deutscher, FNP

## 2023-07-23 NOTE — Patient Instructions (Signed)

## 2023-08-03 ENCOUNTER — Other Ambulatory Visit: Payer: Self-pay | Admitting: Nurse Practitioner

## 2023-08-03 DIAGNOSIS — Z1231 Encounter for screening mammogram for malignant neoplasm of breast: Secondary | ICD-10-CM

## 2023-08-25 ENCOUNTER — Ambulatory Visit: Payer: BC Managed Care – PPO | Admitting: Nurse Practitioner

## 2023-08-25 ENCOUNTER — Encounter: Payer: Self-pay | Admitting: Nurse Practitioner

## 2023-08-25 VITALS — BP 132/79 | HR 67 | Temp 98.1°F | Ht 67.0 in | Wt 136.0 lb

## 2023-08-25 DIAGNOSIS — Z0001 Encounter for general adult medical examination with abnormal findings: Secondary | ICD-10-CM

## 2023-08-25 DIAGNOSIS — Z0000 Encounter for general adult medical examination without abnormal findings: Secondary | ICD-10-CM

## 2023-08-25 DIAGNOSIS — M810 Age-related osteoporosis without current pathological fracture: Secondary | ICD-10-CM | POA: Diagnosis not present

## 2023-08-25 DIAGNOSIS — F419 Anxiety disorder, unspecified: Secondary | ICD-10-CM

## 2023-08-25 NOTE — Progress Notes (Signed)
 Subjective:    Patient ID: Julie Cantrell, female    DOB: Dec 15, 1965, 58 y.o.   MRN: [id number]   Chief Complaint: Annual Exam (No pap/)    HPI:  Julie Cantrell is a 58 y.o. who identifies as a female who was assigned female at birth.   Social history: Lives with: by herself Work history: works at a [employer] in today for follow up of the following chronic medical issues:  1. Annual physical exam Patient has not been seen for physical since her husband passed away several years ago.   2. Anxiety She is on lexapro  and has xanax  prescription to take on an as needed basis    08/25/2023    4:11 PM 07/07/2023    4:11 PM 06/30/2023    3:03 PM  Depression screen PHQ 2/9  Decreased Interest 0 0 0  Down, Depressed, Hopeless 0 0 0  PHQ - 2 Score 0 0 0  Altered sleeping   0  Tired, decreased energy   0  Change in appetite   0  Feeling bad or failure about yourself    0  Trouble concentrating   0  Moving slowly or fidgety/restless   0  Suicidal thoughts   0  PHQ-9 Score   0  Difficult doing work/chores   Not difficult at all      06/30/2023    3:03 PM 04/23/2021   10:15 AM 10/04/2020    4:27 PM  GAD 7 : Generalized Anxiety Score  Nervous, Anxious, on Edge 0 1 3  Control/stop worrying 0 2 1  Worry too much - different things 0 2 3  Trouble relaxing 0 0 3  Restless 0 0 0  Easily annoyed or irritable 0 1 0  Afraid - awful might happen 0 2 2  Total GAD 7 Score 0 8 12  Anxiety Difficulty Not difficult at all Not difficult at all Very difficult        3. Age-related osteoporosis without current pathological fracture On fosamax  and is doing well.  Last dexascan was done on 10/09/21. We do not have results because was done at another facility. She does some weigh bearing exercises.   New complaints: None today  No Known Allergies Outpatient Encounter Medications as of 08/25/2023  Medication Sig   alendronate  (FOSAMAX ) 70 MG tablet Take 1 tablet  (70 mg total) by mouth every 7 (seven) days. Take with a full glass of water on an empty stomach.   ALPRAZolam  (XANAX ) 0.25 MG tablet TAKE ONE TABLET AT BEDTIME AS NEEDED (Patient taking differently: 0.25 mg at bedtime as needed for anxiety.)   escitalopram  (LEXAPRO ) 10 MG tablet Take 1 tablet (10 mg total) by mouth daily.   fluticasone  (FLONASE ) 50 MCG/ACT nasal spray Place 2 sprays into both nostrils daily.   [DISCONTINUED] moxifloxacin  (AVELOX ) 400 MG tablet Take 1 tablet (400 mg total) by mouth daily.   [DISCONTINUED] ondansetron  (ZOFRAN ) 4 MG tablet Take 1 tablet (4 mg total) by mouth every 8 (eight) hours as needed for nausea or vomiting.   [DISCONTINUED] trimethoprim -polymyxin b  (POLYTRIM ) ophthalmic solution Place 1 drop into the left eye every 6 (six) hours.   No facility-administered encounter medications on file as of 08/25/2023.    Past Surgical History:  Procedure Laterality Date   BREAST BIOPSY Left    KIDNEY SURGERY  1985   LAPAROSCOPIC ENDOMETRIOSIS FULGURATION     LAPAROSCOPIC TOTAL HYSTERECTOMY  08/2010   menorrhagia/adenomyosis  NASAL SINUS SURGERY  1995    Family History  Problem Relation Age of Onset   Diabetes Mother    Hypertension Mother    Hypertension Father    Diabetes Father    Diabetes Brother    Colon cancer Neg Hx    Esophageal cancer Neg Hx    Rectal cancer Neg Hx    Stomach cancer Neg Hx       Controlled substance contract: n/a     Review of Systems  Constitutional:  Negative for diaphoresis.  Eyes:  Negative for pain.  Respiratory:  Negative for shortness of breath.   Cardiovascular:  Negative for chest pain, palpitations and leg swelling.  Gastrointestinal:  Negative for abdominal pain.  Endocrine: Negative for polydipsia.  Skin:  Negative for rash.  Neurological:  Negative for dizziness, weakness and headaches.  Hematological:  Does not bruise/bleed easily.  All other systems reviewed and are negative.      Objective:    Physical Exam Vitals and nursing note reviewed.  Constitutional:      General: She is not in acute distress.    Appearance: Normal appearance. She is well-developed.  HENT:     Head: Normocephalic.     Right Ear: Tympanic membrane normal.     Left Ear: Tympanic membrane normal.     Nose: Nose normal.     Mouth/Throat:     Mouth: Mucous membranes are moist.  Eyes:     Pupils: Pupils are equal, round, and reactive to light.  Neck:     Vascular: No carotid bruit or JVD.  Cardiovascular:     Rate and Rhythm: Normal rate and regular rhythm.     Heart sounds: Normal heart sounds.  Pulmonary:     Effort: Pulmonary effort is normal. No respiratory distress.     Breath sounds: Normal breath sounds. No wheezing or rales.  Chest:     Chest wall: No tenderness.  Abdominal:     General: Bowel sounds are normal. There is no distension or abdominal bruit.     Palpations: Abdomen is soft. There is no hepatomegaly, splenomegaly, mass or pulsatile mass.     Tenderness: There is no abdominal tenderness.  Musculoskeletal:        General: Normal range of motion.     Cervical back: Normal range of motion and neck supple.  Lymphadenopathy:     Cervical: No cervical adenopathy.  Skin:    General: Skin is warm and dry.  Neurological:     Mental Status: She is alert and oriented to person, place, and time.     Deep Tendon Reflexes: Reflexes are normal and symmetric.  Psychiatric:        Behavior: Behavior normal.        Thought Content: Thought content normal.        Judgment: Judgment normal.    BP 132/79   Pulse 67   Temp 98.1 F (36.7 C) (Temporal)   Ht 5' 7 (1.702 m)   Wt 136 lb (61.7 kg)   LMP 09/27/2010   SpO2 99%   BMI 21.30 kg/m        Assessment & Plan:  Julie Cantrell comes in today with chief complaint of Annual Exam (No pap/)   Diagnosis and orders addressed:  1. Annual physical exam Keep appt for pap and mammogram - CBC with Differential/Platelet -  CMP14+EGFR - Lipid panel - Thyroid  Panel With TSH - VITAMIN D  25 Hydroxy (Vit-D Deficiency, Fractures)  2.  Anxiety (Primary) Stress management  3. Age-related osteoporosis without current pathological fracture Weight bearing exercise   Labs pending Health Maintenance reviewed Diet and exercise encouraged  Follow up plan: prn   Mary-Margaret Gladis, FNP

## 2023-08-25 NOTE — Patient Instructions (Signed)
 Exercising to Stay Healthy To become healthy and stay healthy, it is recommended that you do moderate-intensity and vigorous-intensity exercise. You can tell that you are exercising at a moderate intensity if your heart starts beating faster and you start breathing faster but can still hold a conversation. You can tell that you are exercising at a vigorous intensity if you are breathing much harder and faster and cannot hold a conversation while exercising. How can exercise benefit me? Exercising regularly is important. It has many health benefits, such as: Improving overall fitness, flexibility, and endurance. Increasing bone density. Helping with weight control. Decreasing body fat. Increasing muscle strength and endurance. Reducing stress and tension, anxiety, depression, or anger. Improving overall health. What guidelines should I follow while exercising? Before you start a new exercise program, talk with your health care provider. Do not exercise so much that you hurt yourself, feel dizzy, or get very short of breath. Wear comfortable clothes and wear shoes with good support. Drink plenty of water while you exercise to prevent dehydration or heat stroke. Work out until your breathing and your heartbeat get faster (moderate intensity). How often should I exercise? Choose an activity that you enjoy, and set realistic goals. Your health care provider can help you make an activity plan that is individually designed and works best for you. Exercise regularly as told by your health care provider. This may include: Doing strength training two times a week, such as: Lifting weights. Using resistance bands. Push-ups. Sit-ups. Yoga. Doing a certain intensity of exercise for a given amount of time. Choose from these options: A total of 150 minutes of moderate-intensity exercise every week. A total of 75 minutes of vigorous-intensity exercise every week. A mix of moderate-intensity and  vigorous-intensity exercise every week. Children, pregnant women, people who have not exercised regularly, people who are overweight, and older adults may need to talk with a health care provider about what activities are safe to perform. If you have a medical condition, be sure to talk with your health care provider before you start a new exercise program. What are some exercise ideas? Moderate-intensity exercise ideas include: Walking 1 mile (1.6 km) in about 15 minutes. Biking. Hiking. Golfing. Dancing. Water aerobics. Vigorous-intensity exercise ideas include: Walking 4.5 miles (7.2 km) or more in about 1 hour. Jogging or running 5 miles (8 km) in about 1 hour. Biking 10 miles (16.1 km) or more in about 1 hour. Lap swimming. Roller-skating or in-line skating. Cross-country skiing. Vigorous competitive sports, such as football, basketball, and soccer. Jumping rope. Aerobic dancing. What are some everyday activities that can help me get exercise? Yard work, such as: Child psychotherapist. Raking and bagging leaves. Washing your car. Pushing a stroller. Shoveling snow. Gardening. Washing windows or floors. How can I be more active in my day-to-day activities? Use stairs instead of an elevator. Take a walk during your lunch break. If you drive, park your car farther away from your work or school. If you take public transportation, get off one stop early and walk the rest of the way. Stand up or walk around during all of your indoor phone calls. Get up, stretch, and walk around every 30 minutes throughout the day. Enjoy exercise with a friend. Support to continue exercising will help you keep a regular routine of activity. Where to find more information You can find more information about exercising to stay healthy from: U.[place]. Department of Health and Human Services: ThisPath.fi Centers for Disease Control and Prevention (  CDC): FootballExhibition.com.br Summary Exercising regularly is  important. It will improve your overall fitness, flexibility, and endurance. Regular exercise will also improve your overall health. It can help you control your weight, reduce stress, and improve your bone density. Do not exercise so much that you hurt yourself, feel dizzy, or get very short of breath. Before you start a new exercise program, talk with your health care provider. This information is not intended to replace advice given to you by your health care provider. Make sure you discuss any questions you have with your health care provider. Document Revised: 11/23/2020 Document Reviewed: 11/23/2020 Elsevier Patient Education  2024 [employer].

## 2023-08-26 LAB — LIPID PANEL
Chol/HDL Ratio: 3.9 {ratio} (ref 0.0–4.4)
Cholesterol, Total: 220 mg/dL — ABNORMAL HIGH (ref 100–199)
HDL: 57 mg/dL (ref >39)
LDL Chol Calc (NIH): 137 mg/dL — ABNORMAL HIGH (ref 0–99)
Triglycerides: 147 mg/dL (ref 0–149)
VLDL Cholesterol Cal: 26 mg/dL (ref 5–40)

## 2023-08-26 LAB — CMP14+EGFR
ALT: 15 [IU]/L (ref 0–32)
AST: 20 [IU]/L (ref 0–40)
Albumin: 4.4 g/dL (ref 3.8–4.9)
Alkaline Phosphatase: 66 [IU]/L (ref 44–121)
BUN/Creatinine Ratio: 18 (ref 9–23)
BUN: 15 mg/dL (ref 6–24)
Bilirubin Total: 0.3 mg/dL (ref 0.0–1.2)
CO2: 25 mmol/L (ref 20–29)
Calcium: 8.8 mg/dL (ref 8.7–10.2)
Chloride: 99 mmol/L (ref 96–106)
Creatinine, Ser: 0.84 mg/dL (ref 0.57–1.00)
Globulin, Total: 2 g/dL (ref 1.5–4.5)
Glucose: 87 mg/dL (ref 70–99)
Potassium: 4.4 mmol/L (ref 3.5–5.2)
Sodium: 137 mmol/L (ref 134–144)
Total Protein: 6.4 g/dL (ref 6.0–8.5)
eGFR: 80 mL/min/{1.73_m2} (ref >59)

## 2023-08-26 LAB — CBC WITH DIFFERENTIAL/PLATELET
Basophils Absolute: 0 10*3/uL (ref 0.0–0.2)
Basos: 1 %
EOS (ABSOLUTE): 0.2 10*3/uL (ref 0.0–0.4)
Eos: 3 %
Hematocrit: 39.2 % (ref 34.0–46.6)
Hemoglobin: 12.9 g/dL (ref 11.1–15.9)
Immature Grans (Abs): 0 10*3/uL (ref 0.0–0.1)
Immature Granulocytes: 0 %
Lymphocytes Absolute: 1.9 10*3/uL (ref 0.7–3.1)
Lymphs: 34 %
MCH: 30 pg (ref 26.6–33.0)
MCHC: 32.9 g/dL (ref 31.5–35.7)
MCV: 91 fL (ref 79–97)
Monocytes Absolute: 0.5 10*3/uL (ref 0.1–0.9)
Monocytes: 9 %
Neutrophils Absolute: 2.9 10*3/uL (ref 1.4–7.0)
Neutrophils: 53 %
Platelets: 249 10*3/uL (ref 150–450)
RBC: 4.3 x10E6/uL (ref 3.77–5.28)
RDW: 12 % (ref 11.7–15.4)
WBC: 5.5 10*3/uL (ref 3.4–10.8)

## 2023-08-26 LAB — VITAMIN D 25 HYDROXY (VIT D DEFICIENCY, FRACTURES): Vit D, 25-Hydroxy: 26.2 ng/mL — ABNORMAL LOW (ref 30.0–100.0)

## 2023-08-26 LAB — THYROID PANEL WITH TSH
Free Thyroxine Index: 1.7 (ref 1.2–4.9)
T3 Uptake Ratio: 25 % (ref 24–39)
T4, Total: 6.7 ug/dL (ref 4.5–12.0)
TSH: 2.54 u[IU]/mL (ref 0.450–4.500)

## 2023-08-27 ENCOUNTER — Ambulatory Visit
Admission: RE | Admit: 2023-08-27 | Discharge: 2023-08-27 | Disposition: A | Discharge status: Home / Self Care | Payer: BC Managed Care – PPO | Source: Ambulatory Visit | Attending: Nurse Practitioner | Admitting: Nurse Practitioner

## 2023-08-27 DIAGNOSIS — Z1231 Encounter for screening mammogram for malignant neoplasm of breast: Secondary | ICD-10-CM

## 2023-09-03 ENCOUNTER — Other Ambulatory Visit: Payer: Self-pay | Admitting: Nurse Practitioner

## 2023-09-03 ENCOUNTER — Telehealth: Payer: Self-pay

## 2023-09-03 DIAGNOSIS — F419 Anxiety disorder, unspecified: Secondary | ICD-10-CM

## 2023-09-03 MED ORDER — ALPRAZOLAM 0.25 MG PO TABS: ORAL_TABLET | ORAL | 2 refills | Status: AC

## 2023-09-03 NOTE — Telephone Encounter (Signed)
Copied from CRM [id number]. Topic: Clinical - Prescription Issue >> Sep 03, 2023 10:38 AM Geroge Baseman wrote: Reason for CRM: Patient is still waiting on her refills of xanax and lexapro. Pharmacy states they do not have the medicine. Looks like it was reordered. 09/03/2023 please reach out to patient to advise what she is waiting  on

## 2023-09-03 NOTE — Telephone Encounter (Signed)
Will you send in Xanax?

## 2023-09-03 NOTE — Addendum Note (Signed)
Addended by: Bennie Pierini on: 09/03/2023 02:39 PM   Modules accepted: Orders

## 2023-09-03 NOTE — Telephone Encounter (Signed)
Copied from CRM [id number]. Topic: Clinical - Prescription Issue >> Sep 03, 2023 11:11 AM Jorje Guild R wrote: Reason for CRM: Patient was advised that prescription for Xanax was stated that she has to have an appointment to get refilled. But patient was just in office for refill appointment on 08/25/2023, Patient wants to be contacted to know how to move forward so can get medication.

## 2023-10-06 ENCOUNTER — Ambulatory Visit: Payer: BC Managed Care – PPO | Admitting: Nurse Practitioner

## 2023-10-06 ENCOUNTER — Encounter: Payer: Self-pay | Admitting: Nurse Practitioner

## 2023-10-06 VITALS — BP 125/74 | HR 66 | Temp 98.0°F | Ht 67.0 in | Wt 137.0 lb

## 2023-10-06 DIAGNOSIS — R0981 Nasal congestion: Secondary | ICD-10-CM

## 2023-10-06 MED ORDER — FLUTICASONE PROPIONATE 50 MCG/ACT NA SUSP: 2.0000 | Freq: Every day | NASAL | 6 refills | Status: AC

## 2023-10-06 MED ORDER — PREDNISONE 20 MG PO TABS: 40.0000 mg | ORAL_TABLET | Freq: Every day | ORAL | 0 refills | Status: AC

## 2023-10-06 NOTE — Progress Notes (Addendum)
 Subjective:    Patient ID: Julie Cantrell, female    DOB: [birth year], 58 y.o.   MRN: [id number]   Chief Complaint: URI  URI  This is a new problem. The current episode started 1 to 4 weeks ago. The problem has been waxing and waning. There has been no fever. Associated symptoms include congestion, coughing (slight), rhinorrhea and sneezing. She has tried acetaminophen for the symptoms. The treatment provided mild relief.    Patient Active Problem List   Diagnosis Date Noted   Hx of colonic polyps 01/25/2019   Anxiety    PVC's (premature ventricular contractions) 12/03/2010       Review of Systems  HENT:  Positive for congestion, rhinorrhea and sneezing.   Respiratory:  Positive for cough (slight).        Objective:   Physical Exam HENT:     Right Ear: Tympanic membrane normal.     Left Ear: Tympanic membrane normal.     Nose: Congestion and rhinorrhea present.     Mouth/Throat:     Mouth: Mucous membranes are moist.     Pharynx: No oropharyngeal exudate or posterior oropharyngeal erythema.  Cardiovascular:     Rate and Rhythm: Normal rate and regular rhythm.     Pulses: Normal pulses.     Heart sounds: Normal heart sounds.  Pulmonary:     Breath sounds: Normal breath sounds.  Musculoskeletal:     Cervical back: Normal range of motion and neck supple.  Skin:    General: Skin is warm.  Neurological:     General: No focal deficit present.     Mental Status: She is alert and oriented to person, place, and time.  Psychiatric:        Mood and Affect: Mood normal.        Behavior: Behavior normal.    BP 125/74   Pulse 66   Temp 98 F (36.7 C) (Temporal)   Ht 5\' 7"  (1.702 m)   Wt 137 lb (62.1 kg)   LMP 09/27/2010   SpO2 99%   BMI 21.46 kg/m         Assessment & Plan:   Julie Cantrell in today with chief complaint of No chief complaint on file.   1. Nasal congestion (Primary) 1. Take meds as prescribed 2. Use a cool mist humidifier  especially during the winter months and when heat has been humid. 3. Use saline nose sprays frequently 4. Saline irrigations of the nose can be very helpful if done frequently.  * 4X daily for 1 week*  * Use of a nettie pot can be helpful with this. Follow directions with this* 5. Drink plenty of fluids 6. Keep thermostat turn down low 7.For any cough or congestion- delsym if needed 8. For fever or aces or pains- take tylenol or ibuprofen appropriate for age and weight.  * for fevers greater than 101 orally you may alternate ibuprofen and tylenol every  3 hours.   Claritin nightly OTC  - fluticasone (FLONASE) 50 MCG/ACT nasal spray; Place 2 sprays into both nostrils daily.  Dispense: 16 g; Refill: 6 - predniSONE (DELTASONE) 20 MG tablet; Take 2 tablets (40 mg total) by mouth daily with breakfast for 5 days. 2 po daily for 5 days  Dispense: 10 tablet; Refill: 0    The above assessment and management plan was discussed with the patient. The patient verbalized understanding of and has agreed to the management plan. Patient is aware to call the  clinic if symptoms persist or worsen. Patient is aware when to return to the clinic for a follow-up visit. Patient educated on when it is appropriate to go to the emergency department.   Mary-Margaret Daphine Deutscher, FNP

## 2023-10-06 NOTE — Patient Instructions (Signed)

## 2023-10-13 ENCOUNTER — Ambulatory Visit (INDEPENDENT_AMBULATORY_CARE_PROVIDER_SITE_OTHER): Payer: BC Managed Care – PPO | Admitting: Nurse Practitioner

## 2023-10-13 ENCOUNTER — Other Ambulatory Visit (HOSPITAL_COMMUNITY)
Admission: RE | Admit: 2023-10-13 | Discharge: 2023-10-13 | Disposition: A | Discharge status: Home / Self Care | Source: Ambulatory Visit | Attending: Nurse Practitioner | Admitting: Nurse Practitioner

## 2023-10-13 ENCOUNTER — Encounter: Payer: Self-pay | Admitting: Nurse Practitioner

## 2023-10-13 VITALS — BP 114/78 | HR 64 | Wt 136.0 lb

## 2023-10-13 DIAGNOSIS — Z01419 Encounter for gynecological examination (general) (routine) without abnormal findings: Secondary | ICD-10-CM

## 2023-10-13 DIAGNOSIS — Z1272 Encounter for screening for malignant neoplasm of vagina: Secondary | ICD-10-CM

## 2023-10-13 DIAGNOSIS — M810 Age-related osteoporosis without current pathological fracture: Secondary | ICD-10-CM

## 2023-10-13 DIAGNOSIS — Z780 Asymptomatic menopausal state: Secondary | ICD-10-CM

## 2023-10-13 DIAGNOSIS — R92343 Mammographic extreme density, bilateral breasts: Secondary | ICD-10-CM

## 2023-10-13 MED ORDER — ALENDRONATE SODIUM 70 MG PO TABS: 70.0000 mg | ORAL_TABLET | ORAL | 0 refills | Status: DC

## 2023-10-13 NOTE — Progress Notes (Signed)
 Julie Cantrell [facility] 04-15-1966 [id number]   History:  58 y.o. G1P1001 presents for annual exam. Postmenopausal - no HRT. S/P 2012 TVH for adenomyosis and menorrhagia. 2006 LGSIL and 2007 ASCUS, paps normal since. 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 Started on Fosamax March 2023, tolerating well. H/O bilateral breast pain, cat d breast density. Interested in MRI or other advanced screenings.   Gynecologic History Patient's last menstrual period was 09/27/2010.   Contraception/Family planning: status post hysterectomy Sexually active: No  Health Maintenance Last Pap: 09/12/2020. Results were: Normal Last mammogram: 10/15/2022. Results were: Normal Last colonoscopy: 01/19/2019. Results were: Polyps, 7-year recall Last Dexa: 10/09/2021. Results were: T-score -2.5  Past medical history, past surgical history, family history and social history were all reviewed and documented in the EPIC chart. Widowed. Works at [employer]. Husband deceased from pancreatic cancer. Daughter - married, ICU nurse for [employer], 10 mo son "Hudson". Family history of osteoporosis.   ROS:  A ROS was performed and pertinent positives and negatives are included.  Exam:  Vitals:   10/13/23 1528  BP: 114/78  Pulse: 64  SpO2: 98%  Weight: 136 lb (61.7 kg)     Body mass index is 21.3 kg/m.  General appearance:  Normal Thyroid:  Symmetrical, normal in size, without palpable masses or nodularity. Respiratory  Auscultation:  Clear without wheezing or rhonchi Cardiovascular  Auscultation:  Regular rate, without rubs, murmurs or gallops  Edema/varicosities:  Not grossly evident Abdominal  Soft,nontender, without masses, guarding or rebound.  Liver/spleen:  No organomegaly noted  Hernia:  None appreciated  Skin  Inspection:  Grossly normal Breasts: Examined lying and sitting.   Right: Without masses, retractions, nipple discharge or axillary adenopathy.   Left: Without masses, retractions, nipple  discharge or axillary adenopathy. Pelvic: External genitalia:  no lesions              Urethra:  normal appearing urethra with no masses, tenderness or lesions              Bartholins and Skenes: normal                 Vagina: normal appearing vagina with normal color and discharge, no lesions              Cervix: absent Bimanual Exam:  Uterus:  absent              Adnexa: no mass, fullness, tenderness              Rectovaginal: Deferred              Anus:  normal, no lesions  Patient informed chaperone available to be present for breast and pelvic exam. Patient has requested no chaperone to be present. Patient has been advised what will be completed during breast and pelvic exam.   Assessment/Plan:  58 y.o. G1P1001 for annual exam.   Well female exam with routine gynecological exam - Education provided on SBEs, importance of preventative screenings, current guidelines, high calcium diet, regular exercise, and multivitamin daily.  Labs with PCP.   Postmenopausal - No HRT. 2012 TVH for adenomyosis and menorrhagia.   Age-related osteoporosis without current pathological fracture - Plan: alendronate (FOSAMAX) 70 MG tablet weekly. Tolerating well. Will schedule DXA at [facility].   Extremely dense tissue of both breasts on mammography - Category D breast tissue. Breast MRI in 2021 following biopsy. Tried to schedule MRI last year, insurance denied. Discussed guidelines. Information on Her Scan provided.  Vaginal Pap smear - Plan: Cytology - PAP( [place])  Screening for cervical cancer - Normal Pap history.  Discussed option to stop screenings per guidelines. Would like to continue. Vaginal pap collected today.   Screening for colon cancer - 2020 colonoscopy. Will repeat at 7-year interval per GI's recommendation.   Return in about 1 year (around 10/12/2024) for Annual.    Olivia Mackie DNP, 4:25 PM 10/13/2023

## 2023-10-15 ENCOUNTER — Encounter: Payer: Self-pay | Admitting: Nurse Practitioner

## 2023-10-15 LAB — CYTOLOGY - PAP: Diagnosis: NEGATIVE

## 2023-10-19 ENCOUNTER — Ambulatory Visit
Admission: RE | Admit: 2023-10-19 | Discharge: 2023-10-19 | Disposition: A | Discharge status: Home / Self Care | Payer: BC Managed Care – PPO | Source: Ambulatory Visit | Attending: Nurse Practitioner | Admitting: Nurse Practitioner

## 2023-10-19 DIAGNOSIS — Z1231 Encounter for screening mammogram for malignant neoplasm of breast: Secondary | ICD-10-CM | POA: Diagnosis not present

## 2023-11-05 ENCOUNTER — Ambulatory Visit (HOSPITAL_BASED_OUTPATIENT_CLINIC_OR_DEPARTMENT_OTHER)
Admission: RE | Admit: 2023-11-05 | Discharge: 2023-11-05 | Disposition: A | Discharge status: Home / Self Care | Source: Ambulatory Visit | Attending: Nurse Practitioner | Admitting: Nurse Practitioner

## 2023-11-05 DIAGNOSIS — M810 Age-related osteoporosis without current pathological fracture: Secondary | ICD-10-CM | POA: Insufficient documentation

## 2023-11-05 DIAGNOSIS — Z0389 Encounter for observation for other suspected diseases and conditions ruled out: Secondary | ICD-10-CM | POA: Diagnosis not present

## 2023-11-09 ENCOUNTER — Encounter: Payer: Self-pay | Admitting: Nurse Practitioner

## 2023-11-09 ENCOUNTER — Other Ambulatory Visit: Payer: Self-pay | Admitting: Nurse Practitioner

## 2023-11-09 DIAGNOSIS — M810 Age-related osteoporosis without current pathological fracture: Secondary | ICD-10-CM

## 2023-11-09 MED ORDER — ALENDRONATE SODIUM 70 MG PO TABS: 70.0000 mg | ORAL_TABLET | ORAL | 3 refills | Status: AC

## 2024-01-06 ENCOUNTER — Ambulatory Visit: Payer: Self-pay

## 2024-01-06 ENCOUNTER — Encounter: Payer: Self-pay | Admitting: Family Medicine

## 2024-01-06 ENCOUNTER — Telehealth (INDEPENDENT_AMBULATORY_CARE_PROVIDER_SITE_OTHER): Admitting: Family Medicine

## 2024-01-06 DIAGNOSIS — J0140 Acute pansinusitis, unspecified: Secondary | ICD-10-CM

## 2024-01-06 DIAGNOSIS — R051 Acute cough: Secondary | ICD-10-CM | POA: Diagnosis not present

## 2024-01-06 DIAGNOSIS — Z8742 Personal history of other diseases of the female genital tract: Secondary | ICD-10-CM

## 2024-01-06 MED ORDER — PREDNISONE 20 MG PO TABS: 40.0000 mg | ORAL_TABLET | Freq: Every day | ORAL | 0 refills | Status: AC

## 2024-01-06 MED ORDER — AMOXICILLIN-POT CLAVULANATE 875-125 MG PO TABS: 1.0000 | ORAL_TABLET | Freq: Two times a day (BID) | ORAL | 0 refills | Status: AC

## 2024-01-06 MED ORDER — BENZONATATE 100 MG PO CAPS: 100.0000 mg | ORAL_CAPSULE | Freq: Three times a day (TID) | ORAL | 0 refills | Status: AC | PRN

## 2024-01-06 MED ORDER — FLUCONAZOLE 150 MG PO TABS: 150.0000 mg | ORAL_TABLET | Freq: Once | ORAL | 0 refills | Status: AC

## 2024-01-06 NOTE — Progress Notes (Signed)
 Virtual Visit via Video   I connected with patient on 01/06/24 at 1300 by a video enabled telemedicine application and verified that I am speaking with the correct person using two identifiers.  Location patient: Home Location provider: [facility] Persons participating in the virtual visit: Patient and Provider  I discussed the limitations of evaluation and management by telemedicine and the availability of in person appointments. The patient expressed understanding and agreed to proceed.  Subjective:   HPI:  Pt presents today for  Chief Complaint  Patient presents with   Sinusitis   Ongoing and worsening symptoms over the last 10 days despite symptomatic care at home.   Sinusitis This is a new problem. The current episode started 1 to 4 weeks ago. The problem has been gradually worsening since onset. The pain is moderate. Associated symptoms include chills, congestion, coughing, ear pain, headaches, a hoarse voice, sinus pressure, sneezing, a sore throat and swollen glands. Pertinent negatives include no diaphoresis, neck pain or shortness of breath. Past treatments include acetaminophen, oral decongestants, saline nose sprays and nasal decongestants. The treatment provided no relief.     Review of Systems  Constitutional:  Positive for chills. Negative for diaphoresis, fever, malaise/fatigue and weight loss.  HENT:  Positive for congestion, ear pain, hoarse voice, sinus pressure, sinus pain, sneezing and sore throat.   Respiratory:  Positive for cough. Negative for shortness of breath.   Musculoskeletal:  Negative for neck pain.  Neurological:  Positive for headaches. Negative for dizziness, tingling, tremors, sensory change, speech change, focal weakness, seizures, loss of consciousness and weakness.  All other systems reviewed and are negative.    Patient Active Problem List   Diagnosis Date Noted   Hx of colonic polyps 01/25/2019    Anxiety    PVC's (premature ventricular contractions) 12/03/2010    Social History   Tobacco Use   Smoking status: Never    Passive exposure: Never   Smokeless tobacco: Never  Substance Use Topics   Alcohol use: Yes    Alcohol/week: 0.0 standard drinks of alcohol    Comment: social    Current Outpatient Medications:    amoxicillin -clavulanate (AUGMENTIN ) 875-125 MG tablet, Take 1 tablet by mouth 2 (two) times daily for 10 days., Disp: 20 tablet, Rfl: 0   benzonatate  (TESSALON  PERLES) 100 MG capsule, Take 1-2 capsules (100-200 mg total) by mouth 3 (three) times daily as needed for cough., Disp: 30 capsule, Rfl: 0   fluconazole  (DIFLUCAN ) 150 MG tablet, Take 1 tablet (150 mg total) by mouth once for 1 dose., Disp: 1 tablet, Rfl: 0   predniSONE  (DELTASONE ) 20 MG tablet, Take 2 tablets (40 mg total) by mouth daily with breakfast for 5 days., Disp: 10 tablet, Rfl: 0   alendronate  (FOSAMAX ) 70 MG tablet, Take 1 tablet (70 mg total) by mouth every 7 (seven) days. Take with a full glass of water on an empty stomach., Disp: 12 tablet, Rfl: 3   ALPRAZolam  (XANAX ) 0.25 MG tablet, TAKE ONE TABLET AT BEDTIME AS NEEDED, Disp: 30 tablet, Rfl: 2   escitalopram  (LEXAPRO ) 10 MG tablet, TAKE ONE (1) TABLET BY MOUTH EVERY DAY, Disp: 90 tablet, Rfl: 0   fluticasone  (FLONASE ) 50 MCG/ACT nasal spray, Place 2 sprays into both nostrils daily., Disp: 16 g, Rfl: 6  No Known Allergies  Objective:   LMP 09/27/2010   Patient is well-developed, well-nourished in no acute distress.  Resting comfortably at home.  Head is normocephalic, atraumatic.  No labored  breathing.  Raspy voice and congested cough present. Patient is alert and oriented at baseline.    Assessment and Plan:   Vercie was seen today for sinusitis.  Diagnoses and all orders for this visit:  Acute non-recurrent pansinusitis Acute cough Has tried and failed symptomatic care at home. Will treat with below. Aware to continue symptomatic care  at home. Report new, worsening, or persistent symptoms.  -     predniSONE  (DELTASONE ) 20 MG tablet; Take 2 tablets (40 mg total) by mouth daily with breakfast for 5 days. -     amoxicillin -clavulanate (AUGMENTIN ) 875-125 MG tablet; Take 1 tablet by mouth 2 (two) times daily for 10 days. -     benzonatate  (TESSALON  PERLES) 100 MG capsule; Take 1-2 capsules (100-200 mg total) by mouth 3 (three) times daily as needed for cough.  History of vaginitis Aware of when to take.  -     fluconazole  (DIFLUCAN ) 150 MG tablet; Take 1 tablet (150 mg total) by mouth once for 1 dose.      Return if symptoms worsen or fail to improve.  Kattie Parrot, FNP-C [facility] [address] [place], [place] [address] [phone]  01/06/2024  Time spent with the patient: 15 minutes, of which >50% was spent in obtaining information about symptoms, reviewing previous labs, evaluations, and treatments, counseling about condition (please see the discussed topics above), and developing a plan to further investigate it; had a number of questions which I addressed.

## 2024-01-06 NOTE — Telephone Encounter (Signed)
 Apt scheduled.

## 2024-01-06 NOTE — Telephone Encounter (Signed)
  Chief Complaint: sinus pressure, ear ache Symptoms: cough, green runny nose, sinus pain and pressure, ear ach Frequency: one week Pertinent Negatives: Patient denies sob Disposition: [] ED /[] Urgent Care (no appt availability in office) / [] Appointment(In office/virtual)/ []  [facility]/ [] Home Care/ [] Refused Recommended Disposition /[] [facility]/ []  Follow-up with PCP Additional Notes: patient with cough/cold symptoms x one week.  Green nasal drainage/ ear pain and pressure.  Fever and chills x one day. Appointment scheduled Copied from CRM [id number]. Topic: Clinical - Red Word Triage >> Jan 06, 2024 10:04 AM Tiffany H wrote: Red Word that prompted transfer to Nurse Triage: Patient called to advise that she has a sore throat, ear pressure. She's training mucus that is green. She suspects a sinus infection. Suspects she had a fever in previous days but not right now. Please assist. Patient would like to see an RN> Reason for Disposition  [1] Sinus pain (not just congestion) AND [2] fever  Answer Assessment - Initial Assessment Questions 1. LOCATION: "Where does it hurt?"      Sinus pain pain and pressure, left ear apin 2. ONSET: "When did the sinus pain start?"  (e.g., hours, days)      One week 3. SEVERITY: "How bad is the pain?"   (Scale 1-10; mild, moderate or severe)   - MILD (1-3): doesn't interfere with normal activities    - MODERATE (4-7): interferes with normal activities (e.g., work or school) or awakens from sleep   - SEVERE (8-10): excruciating pain and patient unable to do any normal activities        moderate 4. RECURRENT SYMPTOM: "Have you ever had sinus problems before?" If Yes, ask: "When was the last time?" and "What happened that time?"      yes 5. NASAL CONGESTION: "Is the nose blocked?" If Yes, ask: "Can you open it or must you breathe through your mouth?"     Nose blocked 6. NASAL DISCHARGE: "Do you have discharge from your nose?" If so ask,  "What color?"     Yes, green 7. FEVER: "Do you have a fever?" If Yes, ask: "What is it, how was it measured, and when did it start?"      yes 8. OTHER SYMPTOMS: "Do you have any other symptoms?" (e.g., sore throat, cough, earache, difficulty breathing)     Cough, earache, green nasal drainage  Protocols used: Sinus Pain or Congestion-A-AH

## 2024-02-02 DIAGNOSIS — M545 Low back pain, unspecified: Secondary | ICD-10-CM | POA: Diagnosis not present

## 2024-02-24 ENCOUNTER — Telehealth: Admitting: Family Medicine

## 2024-02-24 ENCOUNTER — Telehealth: Payer: Self-pay | Admitting: Family Medicine

## 2024-02-24 DIAGNOSIS — H1031 Unspecified acute conjunctivitis, right eye: Secondary | ICD-10-CM | POA: Diagnosis not present

## 2024-02-24 MED ORDER — TOBRAMYCIN-DEXAMETHASONE 0.3-0.1 % OP SUSP: OPHTHALMIC | 0 refills | Status: AC

## 2024-02-24 NOTE — Telephone Encounter (Signed)
 Copied from CRM [id number]. Topic: Clinical - Medication Question >> Feb 24, 2024  8:38 AM Emylou G wrote: Reason for CRM: Patient is looking for script for pinkeye?  Or does she need to be seen?

## 2024-02-24 NOTE — Telephone Encounter (Signed)
 I called pt & I made her a MyChart Video appt today w/DOD Napolean) today!

## 2024-02-28 ENCOUNTER — Encounter: Payer: Self-pay | Admitting: Family Medicine

## 2024-02-28 NOTE — Progress Notes (Signed)
 No chief complaint on file.   HPI  Patient presents today for pain and mattering of the right eye for 2 to 3 days.  It has not affected her vision.  There is a bit of a burning sensation.  She had to come force her eye open with her hands.  There is mattering in the corner and discharge noted.  PMH: Smoking status noted Review of Systems  Objective: LMP 09/27/2010  Gen: NAD, alert, cooperative with exam HEENT: NCAT, EOMI, there is injection of the conch on the right with mild chemosis of upper and lower lid. Neuro: Alert and oriented, No gross deficits  Acute bacterial conjunctivitis of right eye  Other orders -     Tobramycin -dexAMETHasone ; Apply 1 drop in affected eye(s) every 2 hours for two days. Then every 4 hours for 5 days.  Dispense: 5 mL; Refill: 0   Virtual Visit Note  I discussed the limitations, risks, security and privacy concerns of performing an evaluation and management service by computer Internet.  And the availability of in person appointments. I also discussed with the patient that there may be a patient responsible charge related to this service. The patient expressed understanding and agreed to proceed. Pt. Is at home. Dr. Zollie is in his office.  Follow Up Instructions:   I discussed the assessment and treatment plan with the patient. The patient was provided an opportunity to ask questions and all were answered. The patient agreed with the plan and demonstrated an understanding of the instructions.   The patient was advised to call back or seek an in-person evaluation if the symptoms worsen or if the condition fails to improve as anticipated.

## 2024-05-23 DIAGNOSIS — L821 Other seborrheic keratosis: Secondary | ICD-10-CM | POA: Diagnosis not present

## 2024-05-23 DIAGNOSIS — D225 Melanocytic nevi of trunk: Secondary | ICD-10-CM | POA: Diagnosis not present

## 2024-05-23 DIAGNOSIS — B078 Other viral warts: Secondary | ICD-10-CM | POA: Diagnosis not present

## 2024-07-04 DIAGNOSIS — B078 Other viral warts: Secondary | ICD-10-CM | POA: Diagnosis not present

## 2024-08-05 ENCOUNTER — Telehealth: Payer: Self-pay

## 2024-08-05 MED ORDER — OSELTAMIVIR PHOSPHATE 75 MG PO CAPS: 75.0000 mg | ORAL_CAPSULE | Freq: Every day | ORAL | 0 refills | Status: DC

## 2024-08-05 NOTE — Telephone Encounter (Signed)
 Copied from CRM #[id number]. Topic: Clinical - Medication Question >> Aug 05, 2024  1:23 PM Julie Cantrell wrote: The pt says her grandson has the flu and they live with her and is calling to get preventive care for the flu.

## 2024-08-05 NOTE — Addendum Note (Signed)
 Addended by: Freedom Lopezperez, MARY-MARGARET on: 08/05/2024 05:34 PM   Modules accepted: Orders

## 2024-08-05 NOTE — Telephone Encounter (Signed)
 Please review and advise.

## 2024-08-23 ENCOUNTER — Ambulatory Visit: Admitting: Family Medicine

## 2024-08-23 VITALS — BP 110/65 | HR 75 | Temp 98.0°F | Ht 67.0 in | Wt 137.0 lb

## 2024-08-23 DIAGNOSIS — R6889 Other general symptoms and signs: Secondary | ICD-10-CM

## 2024-08-23 DIAGNOSIS — R0981 Nasal congestion: Secondary | ICD-10-CM | POA: Diagnosis not present

## 2024-08-23 DIAGNOSIS — J069 Acute upper respiratory infection, unspecified: Secondary | ICD-10-CM | POA: Diagnosis not present

## 2024-08-23 LAB — VERITOR SARS-COV-2 AND FLU A+B
BD Veritor SARS-CoV-2 Ag: NEGATIVE
Influenza A: NEGATIVE
Influenza B: NEGATIVE

## 2024-08-23 MED ORDER — AMOXICILLIN-POT CLAVULANATE 875-125 MG PO TABS: 1.0000 | ORAL_TABLET | Freq: Two times a day (BID) | ORAL | 0 refills | Status: AC

## 2024-08-23 NOTE — Patient Instructions (Signed)
 Follow up if symptoms acutely worsen, no improvement over the next 2-3 days, fever develops, or for any other concerns

## 2024-08-23 NOTE — Progress Notes (Signed)
 "  Acute Office Visit  Patient ID: Julie Cantrell, female    DOB: September 21, 1965, 59 y.o.   MRN: [id number]  PCP: Gladis Mustard, FNP  Chief Complaint  Patient presents with   SICK VISIT    Patient reports drainage in her throat, cough with mucus, facial pain, body chills, ear pressure/pain since this past weekend (about 3 days). Unsure if she has been running a fever.     Subjective:     HPI  Discussed the use of AI scribe software for clinical note transcription with the patient, who gave verbal consent to proceed.  History of Present Illness   Julie Cantrell is a 60 year old female who presents with possible sinus infection symptoms.   Upper respiratory symptoms x 3 days - Postnasal drainage for approximately three days - Cough with mucus production - Facial pain and pressure - Ear pressure and pain - Teeth ache, similar to previous sinus infections - Sensation described as 'all in my head'  Constitutional symptoms - Body chills, experienced last night - Unsure if fever is present    ROS     Objective:    BP 110/65   Pulse 75   Temp 98 F (36.7 C)   Ht 5' 7 (1.702 m)   Wt 137 lb (62.1 kg)   LMP 09/27/2010   SpO2 96%   BMI 21.46 kg/m    Physical Exam Vitals reviewed.  Constitutional:      Appearance: Normal appearance.  HENT:     Head: Normocephalic and atraumatic.     Right Ear: Tympanic membrane, ear canal and external ear normal.     Left Ear: Tympanic membrane, ear canal and external ear normal.     Nose: Nose normal.     Mouth/Throat:     Mouth: Mucous membranes are moist.     Pharynx: Oropharynx is clear.  Cardiovascular:     Rate and Rhythm: Normal rate and regular rhythm.     Pulses: Normal pulses.     Heart sounds: Normal heart sounds. No murmur heard. Pulmonary:     Effort: Pulmonary effort is normal. No respiratory distress.     Breath sounds: Normal breath sounds.  Musculoskeletal:        General: No deformity.  Normal range of motion.     Cervical back: Normal range of motion.  Skin:    General: Skin is warm and dry.  Neurological:     General: No focal deficit present.     Mental Status: She is alert and oriented to person, place, and time.  Psychiatric:        Mood and Affect: Mood normal.        Behavior: Behavior normal.           Results for orders placed or performed in visit on 08/23/24  Veritor SARS-CoV-2 and Flu A+B   Specimen: Nasal Swab   Nasal Swab  Result Value Ref Range   Influenza A Negative Negative   Influenza B Negative Negative   BD Veritor SARS-CoV-2 Ag Negative Negative       Assessment & Plan:   Problem List Items Addressed This Visit   None Visit Diagnoses       Viral upper respiratory tract infection    -  Primary     Flu-like symptoms       Relevant Orders   Veritor SARS-CoV-2 and Flu A+B (Completed)     Sinus congestion  Relevant Medications   amoxicillin -clavulanate (AUGMENTIN ) 875-125 MG tablet       Assessment and Plan    URI - Covid and flu negative today.  - Discussed that symptoms were likely viral as they have only been present x 3 days.  - Discussed symptomatic care of symptoms.  - Prescribed SNAP prescription of Augmentin  for potential sinus infection if symptoms do not improve over the next 5 days.  - Follow up if symptoms acutely worsen, no improvement over the next 2-3 days, fever develops, or for any other concerns        Meds ordered this encounter  Medications   amoxicillin -clavulanate (AUGMENTIN ) 875-125 MG tablet    Sig: Take 1 tablet by mouth 2 (two) times daily for 7 days.    Dispense:  14 tablet    Refill:  0    Supervising Provider:   JOLINDA NORENE HERO [[id number]]    Return if symptoms worsen or fail to improve.  Oneil LELON Severin, FNP [facility]   "

## 2024-10-13 ENCOUNTER — Ambulatory Visit: Admitting: Nurse Practitioner
